# Patient Record
Sex: Female | Born: 1970 | Race: White | Hispanic: No | Marital: Single | Smoking: Never smoker
Health system: Southern US, Community
[De-identification: ages and names within clinical notes are randomized; demographics above are authoritative.]

## PROBLEM LIST (undated history)

## (undated) DIAGNOSIS — R011 Cardiac murmur, unspecified: Secondary | ICD-10-CM

## (undated) DIAGNOSIS — Z8619 Personal history of other infectious and parasitic diseases: Secondary | ICD-10-CM

## (undated) DIAGNOSIS — R002 Palpitations: Secondary | ICD-10-CM

## (undated) DIAGNOSIS — T7840XA Allergy, unspecified, initial encounter: Secondary | ICD-10-CM

## (undated) DIAGNOSIS — M25569 Pain in unspecified knee: Secondary | ICD-10-CM

## (undated) DIAGNOSIS — R5383 Other fatigue: Secondary | ICD-10-CM

## (undated) DIAGNOSIS — M2550 Pain in unspecified joint: Secondary | ICD-10-CM

## (undated) DIAGNOSIS — S92919A Unspecified fracture of unspecified toe(s), initial encounter for closed fracture: Secondary | ICD-10-CM

## (undated) DIAGNOSIS — O3440 Maternal care for other abnormalities of cervix, unspecified trimester: Secondary | ICD-10-CM

## (undated) DIAGNOSIS — O99210 Obesity complicating pregnancy, unspecified trimester: Secondary | ICD-10-CM

## (undated) DIAGNOSIS — Z9889 Other specified postprocedural states: Secondary | ICD-10-CM

## (undated) HISTORY — DX: Pain in unspecified joint: M25.50

## (undated) HISTORY — DX: Palpitations: R00.2

## (undated) HISTORY — PX: OTHER SURGICAL HISTORY: SHX169

## (undated) HISTORY — DX: Other specified postprocedural states: Z98.890

## (undated) HISTORY — DX: Pain in unspecified knee: M25.569

## (undated) HISTORY — DX: Maternal care for other abnormalities of cervix, unspecified trimester: O34.40

## (undated) HISTORY — DX: Personal history of other infectious and parasitic diseases: Z86.19

## (undated) HISTORY — DX: Allergy, unspecified, initial encounter: T78.40XA

## (undated) HISTORY — DX: Other fatigue: R53.83

## (undated) HISTORY — DX: Obesity complicating pregnancy, unspecified trimester: O99.210

## (undated) HISTORY — DX: Unspecified fracture of unspecified toe(s), initial encounter for closed fracture: S92.919A

---
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
## 1999-03-05 ENCOUNTER — Other Ambulatory Visit: Admission: RE | Admit: 1999-03-05 | Discharge: 1999-03-05 | Payer: Self-pay | Admitting: Obstetrics and Gynecology

## 2000-05-12 ENCOUNTER — Other Ambulatory Visit: Admission: RE | Admit: 2000-05-12 | Discharge: 2000-05-12 | Payer: Self-pay | Admitting: *Deleted

## 2009-07-27 HISTORY — PX: MYOMECTOMY: SHX85

## 2010-01-22 LAB — HEPATITIS B SURFACE ANTIGEN: Hepatitis B Surface Ag: NEGATIVE

## 2010-01-22 LAB — RUBELLA ANTIBODY, IGM: Rubella: IMMUNE

## 2010-01-22 LAB — RPR: RPR: NONREACTIVE

## 2011-04-09 ENCOUNTER — Ambulatory Visit (HOSPITAL_COMMUNITY)
Admission: RE | Admit: 2011-04-09 | Discharge: 2011-04-09 | Disposition: A | Discharge status: Home / Self Care | Payer: Managed Care, Other (non HMO) | Source: Ambulatory Visit | Attending: Obstetrics and Gynecology | Admitting: Obstetrics and Gynecology

## 2011-04-09 ENCOUNTER — Other Ambulatory Visit (HOSPITAL_COMMUNITY): Payer: Self-pay | Admitting: Obstetrics and Gynecology

## 2011-04-09 ENCOUNTER — Encounter (HOSPITAL_COMMUNITY): Payer: Self-pay

## 2011-04-09 DIAGNOSIS — O4420 Partial placenta previa NOS or without hemorrhage, unspecified trimester: Secondary | ICD-10-CM

## 2011-04-09 DIAGNOSIS — O26859 Spotting complicating pregnancy, unspecified trimester: Secondary | ICD-10-CM

## 2011-04-09 DIAGNOSIS — O4410 Placenta previa with hemorrhage, unspecified trimester: Secondary | ICD-10-CM | POA: Insufficient documentation

## 2011-04-09 DIAGNOSIS — O09519 Supervision of elderly primigravida, unspecified trimester: Secondary | ICD-10-CM | POA: Insufficient documentation

## 2011-04-09 DIAGNOSIS — O09529 Supervision of elderly multigravida, unspecified trimester: Secondary | ICD-10-CM

## 2011-04-22 ENCOUNTER — Other Ambulatory Visit (HOSPITAL_COMMUNITY): Payer: Self-pay | Admitting: Obstetrics and Gynecology

## 2011-04-22 DIAGNOSIS — O2690 Pregnancy related conditions, unspecified, unspecified trimester: Secondary | ICD-10-CM

## 2011-05-04 ENCOUNTER — Ambulatory Visit (HOSPITAL_COMMUNITY)
Admission: RE | Admit: 2011-05-04 | Discharge: 2011-05-04 | Disposition: A | Discharge status: Home / Self Care | Payer: Managed Care, Other (non HMO) | Source: Ambulatory Visit | Attending: Obstetrics and Gynecology | Admitting: Obstetrics and Gynecology

## 2011-05-04 DIAGNOSIS — O09519 Supervision of elderly primigravida, unspecified trimester: Secondary | ICD-10-CM | POA: Insufficient documentation

## 2011-05-04 DIAGNOSIS — O3440 Maternal care for other abnormalities of cervix, unspecified trimester: Secondary | ICD-10-CM | POA: Insufficient documentation

## 2011-05-04 DIAGNOSIS — A6000 Herpesviral infection of urogenital system, unspecified: Secondary | ICD-10-CM | POA: Insufficient documentation

## 2011-05-04 DIAGNOSIS — O4410 Placenta previa with hemorrhage, unspecified trimester: Secondary | ICD-10-CM | POA: Insufficient documentation

## 2011-05-04 DIAGNOSIS — O2690 Pregnancy related conditions, unspecified, unspecified trimester: Secondary | ICD-10-CM

## 2011-05-04 DIAGNOSIS — O98519 Other viral diseases complicating pregnancy, unspecified trimester: Secondary | ICD-10-CM | POA: Insufficient documentation

## 2011-05-04 IMAGING — US US OB DETAIL+14 WK
1 series · 14 of 28 positions shown · non-contrast
Comparison: none

[Series 1: us ob detail+14 wk · 0.23mm/px · 14 of 98 slices shown]
[im 4/98]
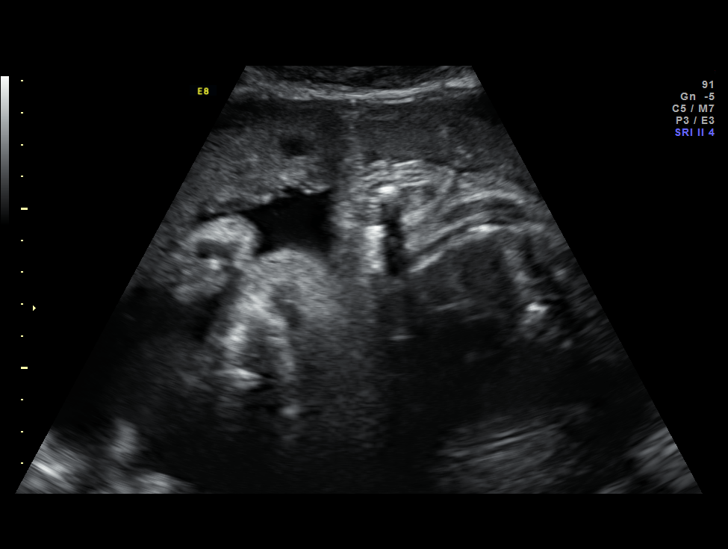
[im 11/98]
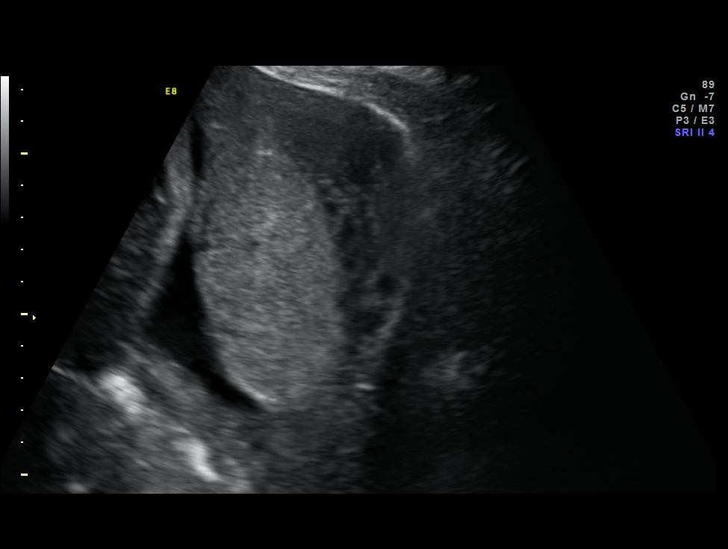
[im 18/98]
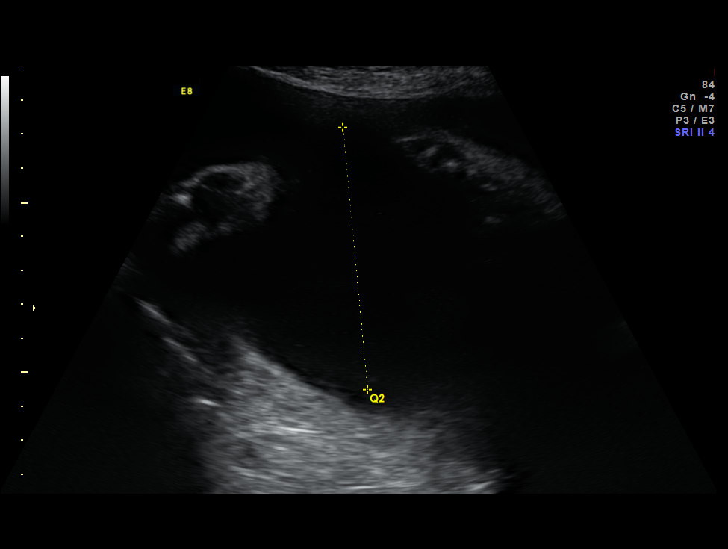
[im 26/98]
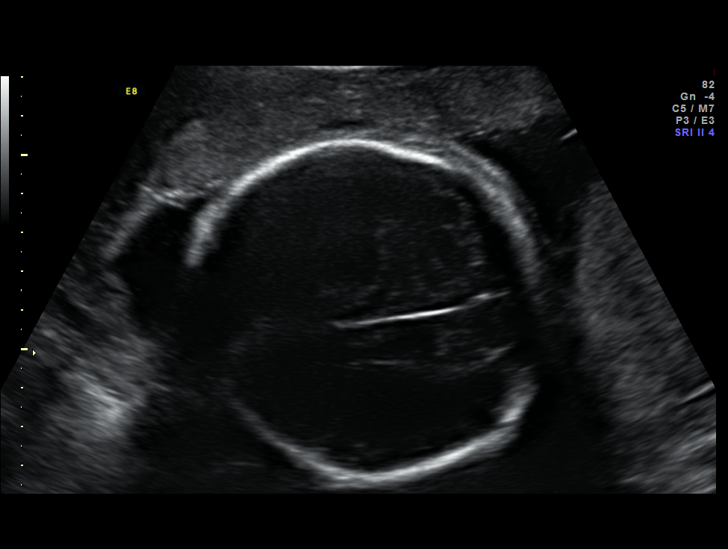
[im 33/98]
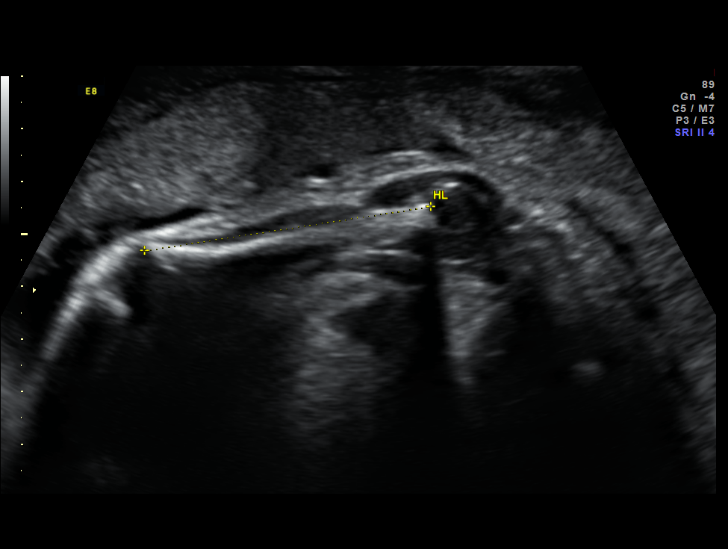
[im 40/98]
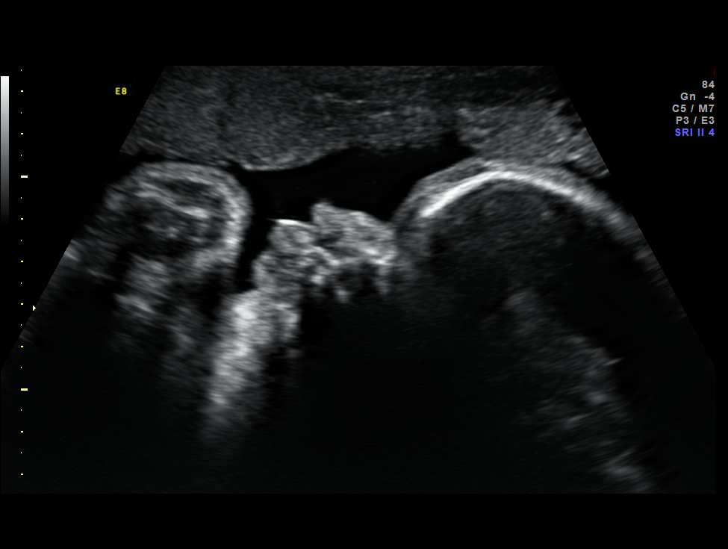
[im 47/98]
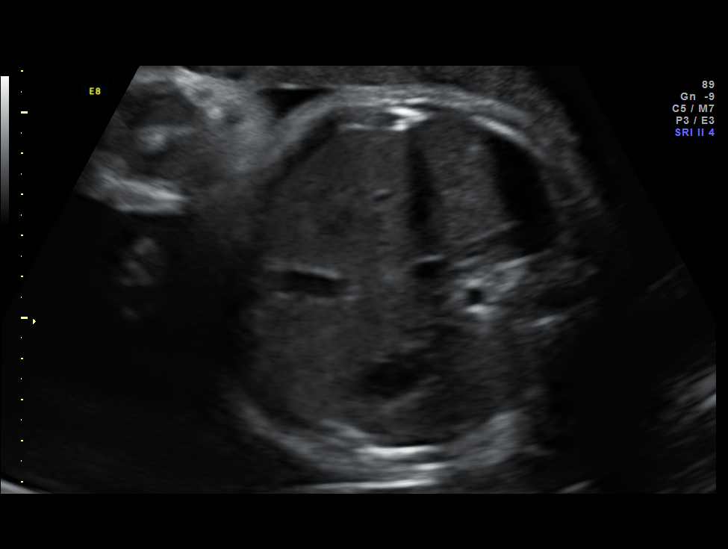
[im 54/98]
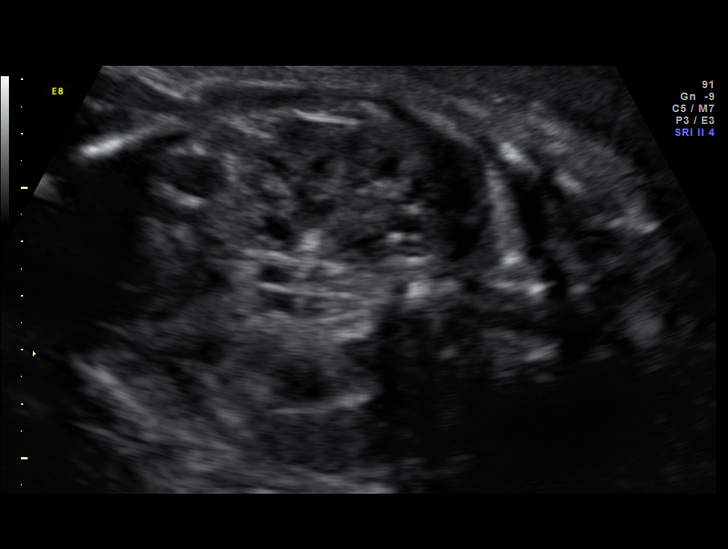
[im 62/98]
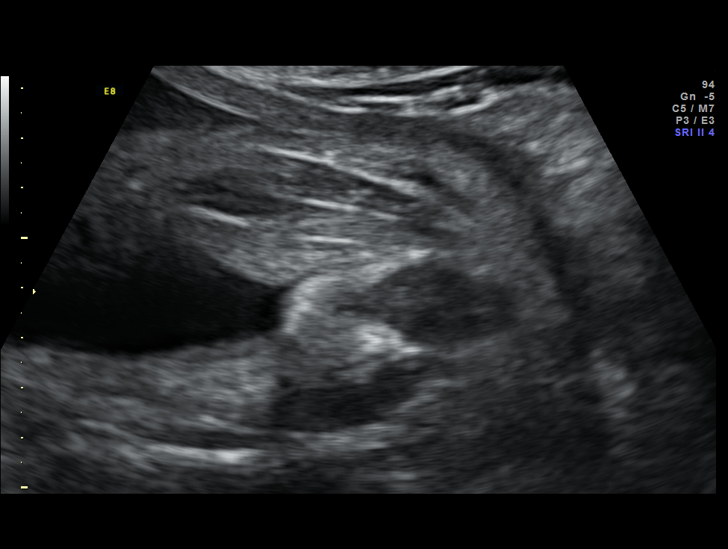
[im 69/98]
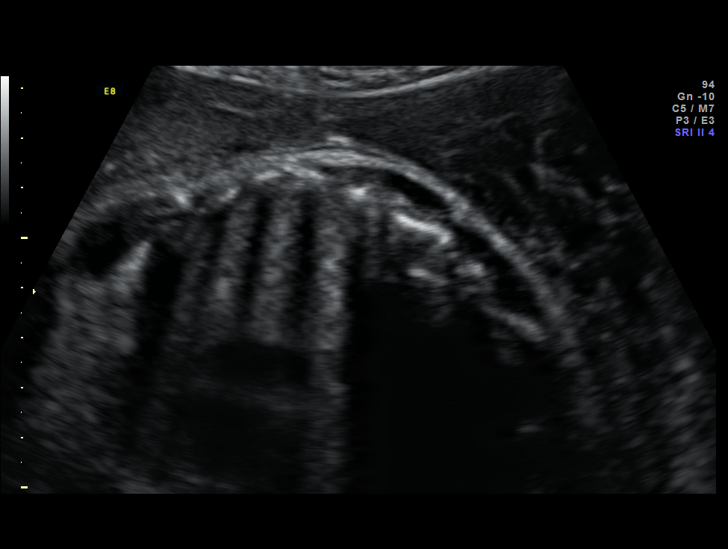
[im 76/98]
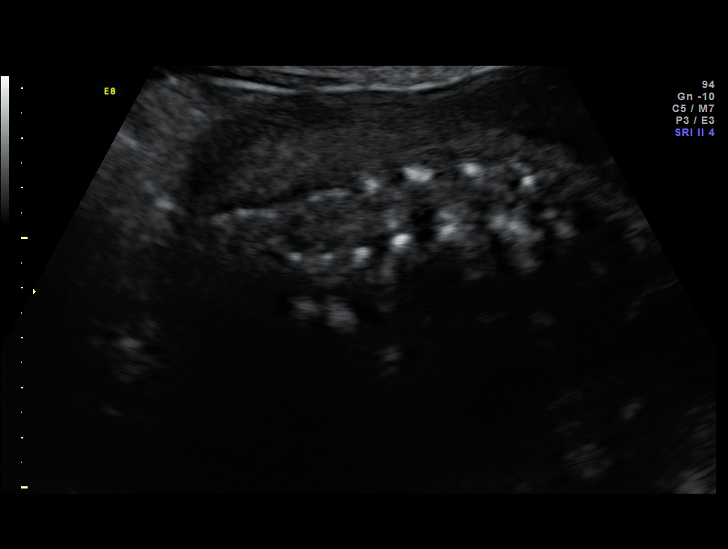
[im 83/98]
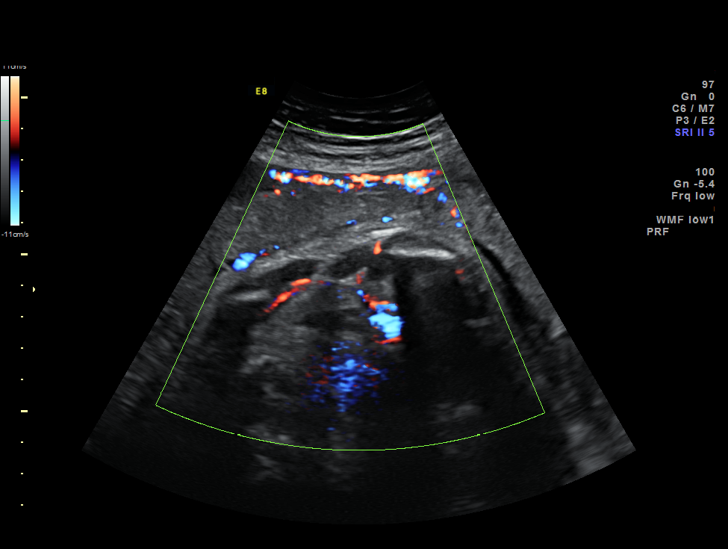
[im 90/98]
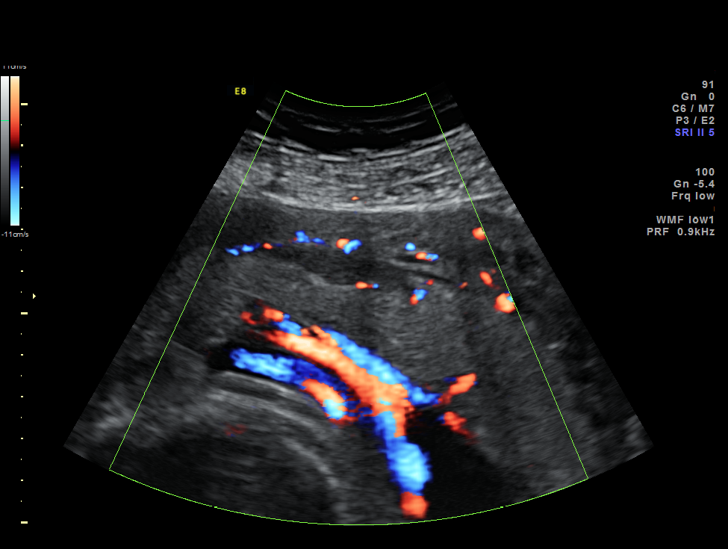
[im 98/98]
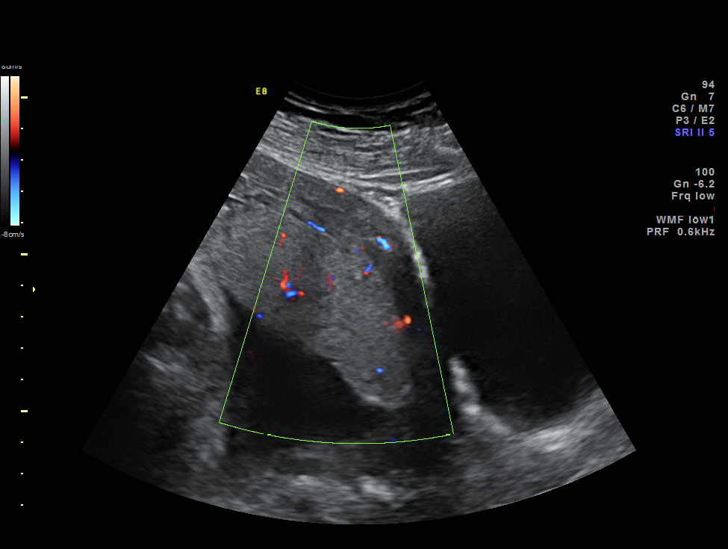

[14 of 28 positions shown; findings below may reference images not displayed]

Canned report from images found in remote index.

Refer to host system for actual result text.

## 2011-05-05 ENCOUNTER — Other Ambulatory Visit: Payer: Self-pay | Admitting: Obstetrics and Gynecology

## 2011-05-07 ENCOUNTER — Other Ambulatory Visit: Payer: Self-pay | Admitting: Obstetrics and Gynecology

## 2011-05-21 ENCOUNTER — Encounter (HOSPITAL_COMMUNITY): Payer: Self-pay | Admitting: Pharmacy Technician

## 2011-05-26 ENCOUNTER — Encounter (HOSPITAL_COMMUNITY)
Admission: RE | Admit: 2011-05-26 | Discharge: 2011-05-26 | Disposition: A | Discharge status: Home / Self Care | Payer: Managed Care, Other (non HMO) | Source: Ambulatory Visit | Attending: Obstetrics and Gynecology | Admitting: Obstetrics and Gynecology

## 2011-05-26 ENCOUNTER — Encounter (HOSPITAL_COMMUNITY): Payer: Self-pay

## 2011-05-26 HISTORY — DX: Cardiac murmur, unspecified: R01.1

## 2011-05-26 LAB — CBC
HCT: 35 % — ABNORMAL LOW (ref 36.0–46.0)
Hemoglobin: 11.7 g/dL — ABNORMAL LOW (ref 12.0–15.0)
MCH: 30.5 pg (ref 26.0–34.0)
MCHC: 33.4 g/dL (ref 30.0–36.0)
MCV: 91.1 fL (ref 78.0–100.0)

## 2011-05-26 LAB — SURGICAL PCR SCREEN: Staphylococcus aureus: NEGATIVE

## 2011-05-26 NOTE — Patient Instructions (Signed)
   Your procedure is scheduled on:05/29/11  Enter through the Main Entrance of [facility] at:6am  Pick up the phone at the desk and dial [phone] and inform us of your arrival.  Please call this number if you have any problems the morning of surgery: [phone]  Remember: Do not eat food after midnight:Thursday Do not drink clear liquids after:3:30am Fri Take these medicines the morning of surgery with a SIP OF WATER:none  Do not wear jewelry, make-up, or FINGER nail polish Do not wear lotions, powders, or perfumes.  You may wear deodorant. Do not shave 48 hours prior to surgery. Do not bring valuables to the hospital.  Leave suitcase in the car. After Surgery it may be brought to your room. For patients being admitted to the hospital, checkout time is 11:00am the day of discharge.  Patients discharged on the day of surgery will not be allowed to drive home.   Name and phone number of your driver: Kingsley Spittle  Remember to use your hibiclens as instructed.Please shower with 1/2 bottle the evening before your surgery and the other 1/2 bottle the morning of surgery.

## 2011-05-27 NOTE — Pre-Procedure Instructions (Signed)
Antibody screen result not found on prenatal record. Limited pages received after 3 requests from office to send.

## 2011-05-28 ENCOUNTER — Encounter (HOSPITAL_COMMUNITY): Payer: Self-pay | Admitting: Obstetrics and Gynecology

## 2011-05-28 DIAGNOSIS — Z9889 Other specified postprocedural states: Secondary | ICD-10-CM | POA: Insufficient documentation

## 2011-05-28 DIAGNOSIS — O09819 Supervision of pregnancy resulting from assisted reproductive technology, unspecified trimester: Secondary | ICD-10-CM | POA: Insufficient documentation

## 2011-05-28 DIAGNOSIS — O4400 Placenta previa specified as without hemorrhage, unspecified trimester: Secondary | ICD-10-CM | POA: Insufficient documentation

## 2011-05-28 DIAGNOSIS — O99210 Obesity complicating pregnancy, unspecified trimester: Secondary | ICD-10-CM | POA: Insufficient documentation

## 2011-05-28 DIAGNOSIS — Z8619 Personal history of other infectious and parasitic diseases: Secondary | ICD-10-CM | POA: Insufficient documentation

## 2011-05-28 DIAGNOSIS — O2690 Pregnancy related conditions, unspecified, unspecified trimester: Secondary | ICD-10-CM | POA: Insufficient documentation

## 2011-05-28 MED ORDER — CEFAZOLIN SODIUM-DEXTROSE 2-3 GM-% IV SOLR
2.0000 g | INTRAVENOUS | Status: AC
  Administered 2011-05-29: 2 g via INTRAVENOUS
  Filled 2011-05-28: qty 50

## 2011-05-29 ENCOUNTER — Encounter (HOSPITAL_COMMUNITY): Payer: Self-pay | Admitting: Obstetrics and Gynecology

## 2011-05-29 ENCOUNTER — Inpatient Hospital Stay (HOSPITAL_COMMUNITY): Payer: Managed Care, Other (non HMO) | Admitting: Anesthesiology

## 2011-05-29 ENCOUNTER — Encounter (HOSPITAL_COMMUNITY): Payer: Self-pay | Admitting: Anesthesiology

## 2011-05-29 ENCOUNTER — Encounter (HOSPITAL_COMMUNITY)
Admission: AD | Disposition: A | Discharge status: Home / Self Care | Payer: Self-pay | Source: Ambulatory Visit | Attending: Obstetrics and Gynecology

## 2011-05-29 ENCOUNTER — Encounter (HOSPITAL_COMMUNITY): Payer: Self-pay | Admitting: *Deleted

## 2011-05-29 ENCOUNTER — Inpatient Hospital Stay (HOSPITAL_COMMUNITY)
Admission: AD | Admit: 2011-05-29 | Discharge: 2011-06-01 | DRG: 765 | Disposition: A | Discharge status: Home / Self Care | Payer: Managed Care, Other (non HMO) | Source: Ambulatory Visit | Attending: Obstetrics and Gynecology | Admitting: Obstetrics and Gynecology

## 2011-05-29 ENCOUNTER — Other Ambulatory Visit: Payer: Self-pay | Admitting: Obstetrics and Gynecology

## 2011-05-29 DIAGNOSIS — O09519 Supervision of elderly primigravida, unspecified trimester: Secondary | ICD-10-CM | POA: Diagnosis present

## 2011-05-29 DIAGNOSIS — O4410 Placenta previa with hemorrhage, unspecified trimester: Principal | ICD-10-CM | POA: Diagnosis present

## 2011-05-29 DIAGNOSIS — O3490 Maternal care for abnormality of pelvic organ, unspecified, unspecified trimester: Secondary | ICD-10-CM | POA: Diagnosis present

## 2011-05-29 LAB — TYPE AND SCREEN
ABO/RH(D): O POS
Antibody Screen: NEGATIVE

## 2011-05-29 SURGERY — Surgical Case
Anesthesia: Spinal | Site: Abdomen | Wound class: Clean Contaminated

## 2011-05-29 MED ORDER — FENTANYL CITRATE 0.05 MG/ML IJ SOLN
INTRAMUSCULAR | Status: AC
  Filled 2011-05-29: qty 2

## 2011-05-29 MED ORDER — OXYTOCIN 20 UNITS IN LACTATED RINGERS INFUSION - SIMPLE
125.0000 mL/h | INTRAVENOUS | Status: AC
  Administered 2011-05-29: 125 mL/h via INTRAVENOUS

## 2011-05-29 MED ORDER — NALBUPHINE SYRINGE 5 MG/0.5 ML
5.0000 mg | INJECTION | INTRAMUSCULAR | Status: DC | PRN
  Filled 2011-05-29: qty 1

## 2011-05-29 MED ORDER — DIBUCAINE 1 % RE OINT: 1.0000 "application " | TOPICAL_OINTMENT | RECTAL | Status: DC | PRN

## 2011-05-29 MED ORDER — MORPHINE SULFATE 0.5 MG/ML IJ SOLN
INTRAMUSCULAR | Status: AC
  Filled 2011-05-29: qty 10

## 2011-05-29 MED ORDER — MENTHOL 3 MG MT LOZG: 1.0000 | LOZENGE | OROMUCOSAL | Status: DC | PRN

## 2011-05-29 MED ORDER — OXYCODONE-ACETAMINOPHEN 5-325 MG PO TABS
1.0000 | ORAL_TABLET | ORAL | Status: DC | PRN
  Administered 2011-05-30 – 2011-05-31 (×3): 1 via ORAL
  Filled 2011-05-29 (×3): qty 1

## 2011-05-29 MED ORDER — OXYTOCIN 10 UNIT/ML IJ SOLN
INTRAMUSCULAR | Status: AC
  Filled 2011-05-29: qty 2

## 2011-05-29 MED ORDER — DIPHENHYDRAMINE HCL 25 MG PO CAPS: 25.0000 mg | ORAL_CAPSULE | Freq: Four times a day (QID) | ORAL | Status: DC | PRN

## 2011-05-29 MED ORDER — ONDANSETRON HCL 4 MG PO TABS: 4.0000 mg | ORAL_TABLET | ORAL | Status: DC | PRN

## 2011-05-29 MED ORDER — ONDANSETRON HCL 4 MG/2ML IJ SOLN
INTRAMUSCULAR | Status: AC
  Filled 2011-05-29: qty 2

## 2011-05-29 MED ORDER — TETANUS-DIPHTH-ACELL PERTUSSIS 5-2.5-18.5 LF-MCG/0.5 IM SUSP
0.5000 mL | Freq: Once | INTRAMUSCULAR | Status: AC
  Administered 2011-05-30: 0.5 mL via INTRAMUSCULAR
  Filled 2011-05-29: qty 0.5

## 2011-05-29 MED ORDER — ONDANSETRON HCL 4 MG/2ML IJ SOLN
INTRAMUSCULAR | Status: DC | PRN
  Administered 2011-05-29: 4 mg via INTRAVENOUS

## 2011-05-29 MED ORDER — BUPIVACAINE IN DEXTROSE 0.75-8.25 % IT SOLN
INTRATHECAL | Status: DC | PRN
  Administered 2011-05-29: 15 mg via INTRATHECAL

## 2011-05-29 MED ORDER — BUPIVACAINE HCL (PF) 0.25 % IJ SOLN
INTRAMUSCULAR | Status: DC | PRN
  Administered 2011-05-29: 20 mL

## 2011-05-29 MED ORDER — SIMETHICONE 80 MG PO CHEW
80.0000 mg | CHEWABLE_TABLET | Freq: Three times a day (TID) | ORAL | Status: DC
  Administered 2011-05-29 – 2011-06-01 (×10): 80 mg via ORAL

## 2011-05-29 MED ORDER — NALOXONE HCL 0.4 MG/ML IJ SOLN: 0.4000 mg | INTRAMUSCULAR | Status: DC | PRN

## 2011-05-29 MED ORDER — IBUPROFEN 600 MG PO TABS
600.0000 mg | ORAL_TABLET | Freq: Four times a day (QID) | ORAL | Status: DC
  Administered 2011-05-29 – 2011-06-01 (×11): 600 mg via ORAL
  Filled 2011-05-29 (×10): qty 1

## 2011-05-29 MED ORDER — DIPHENHYDRAMINE HCL 25 MG PO CAPS: 25.0000 mg | ORAL_CAPSULE | ORAL | Status: DC | PRN

## 2011-05-29 MED ORDER — SODIUM CHLORIDE 0.9 % IJ SOLN: 3.0000 mL | INTRAMUSCULAR | Status: DC | PRN

## 2011-05-29 MED ORDER — LACTATED RINGERS IV SOLN
INTRAVENOUS | Status: DC
  Administered 2011-05-29 (×3): via INTRAVENOUS

## 2011-05-29 MED ORDER — SCOPOLAMINE 1 MG/3DAYS TD PT72
MEDICATED_PATCH | TRANSDERMAL | Status: AC
  Filled 2011-05-29: qty 1

## 2011-05-29 MED ORDER — SIMETHICONE 80 MG PO CHEW: 80.0000 mg | CHEWABLE_TABLET | ORAL | Status: DC | PRN

## 2011-05-29 MED ORDER — OXYTOCIN 20 UNITS IN LACTATED RINGERS INFUSION - SIMPLE
INTRAVENOUS | Status: AC
  Filled 2011-05-29: qty 1000

## 2011-05-29 MED ORDER — PHENYLEPHRINE 40 MCG/ML (10ML) SYRINGE FOR IV PUSH (FOR BLOOD PRESSURE SUPPORT)
PREFILLED_SYRINGE | INTRAVENOUS | Status: AC
  Filled 2011-05-29: qty 5

## 2011-05-29 MED ORDER — SCOPOLAMINE 1 MG/3DAYS TD PT72
MEDICATED_PATCH | TRANSDERMAL | Status: AC
  Administered 2011-05-29: 1.5 mg via TRANSDERMAL
  Filled 2011-05-29: qty 1

## 2011-05-29 MED ORDER — FENTANYL CITRATE 0.05 MG/ML IJ SOLN
INTRAMUSCULAR | Status: DC | PRN
  Administered 2011-05-29: 25 ug via INTRATHECAL

## 2011-05-29 MED ORDER — OXYTOCIN 20 UNITS IN LACTATED RINGERS INFUSION - SIMPLE
INTRAVENOUS | Status: DC | PRN
  Administered 2011-05-29: 20 [IU] via INTRAVENOUS

## 2011-05-29 MED ORDER — WITCH HAZEL-GLYCERIN EX PADS: 1.0000 "application " | MEDICATED_PAD | CUTANEOUS | Status: DC | PRN

## 2011-05-29 MED ORDER — SCOPOLAMINE 1 MG/3DAYS TD PT72
1.0000 | MEDICATED_PATCH | Freq: Once | TRANSDERMAL | Status: DC
  Administered 2011-05-29: 1.5 mg via TRANSDERMAL

## 2011-05-29 MED ORDER — MORPHINE SULFATE (PF) 0.5 MG/ML IJ SOLN
INTRAMUSCULAR | Status: DC | PRN
  Administered 2011-05-29: 150 ug via INTRATHECAL

## 2011-05-29 MED ORDER — SENNOSIDES-DOCUSATE SODIUM 8.6-50 MG PO TABS
2.0000 | ORAL_TABLET | Freq: Every day | ORAL | Status: DC
  Administered 2011-05-30 – 2011-05-31 (×3): 2 via ORAL

## 2011-05-29 MED ORDER — SODIUM CHLORIDE 0.9 % IV SOLN
1.0000 ug/kg/h | INTRAVENOUS | Status: DC | PRN
  Filled 2011-05-29: qty 2.5

## 2011-05-29 MED ORDER — LANOLIN HYDROUS EX OINT: 1.0000 "application " | TOPICAL_OINTMENT | CUTANEOUS | Status: DC | PRN

## 2011-05-29 MED ORDER — ONDANSETRON HCL 4 MG/2ML IJ SOLN: 4.0000 mg | Freq: Three times a day (TID) | INTRAMUSCULAR | Status: DC | PRN

## 2011-05-29 MED ORDER — MEPERIDINE HCL 25 MG/ML IJ SOLN: 6.2500 mg | INTRAMUSCULAR | Status: DC | PRN

## 2011-05-29 MED ORDER — ZOLPIDEM TARTRATE 5 MG PO TABS: 5.0000 mg | ORAL_TABLET | Freq: Every evening | ORAL | Status: DC | PRN

## 2011-05-29 MED ORDER — METOCLOPRAMIDE HCL 5 MG/ML IJ SOLN: 10.0000 mg | Freq: Three times a day (TID) | INTRAMUSCULAR | Status: DC | PRN

## 2011-05-29 MED ORDER — PRENATAL PLUS 27-1 MG PO TABS
1.0000 | ORAL_TABLET | Freq: Every day | ORAL | Status: DC
  Administered 2011-05-30 – 2011-06-01 (×3): 1 via ORAL
  Filled 2011-05-29 (×3): qty 1

## 2011-05-29 MED ORDER — METOCLOPRAMIDE HCL 5 MG/ML IJ SOLN: 10.0000 mg | Freq: Once | INTRAMUSCULAR | Status: DC | PRN

## 2011-05-29 MED ORDER — DIPHENHYDRAMINE HCL 50 MG/ML IJ SOLN: 12.5000 mg | INTRAMUSCULAR | Status: DC | PRN

## 2011-05-29 MED ORDER — PHENYLEPHRINE HCL 10 MG/ML IJ SOLN
INTRAMUSCULAR | Status: DC | PRN
  Administered 2011-05-29 (×2): 80 ug via INTRAVENOUS

## 2011-05-29 MED ORDER — HYDROMORPHONE HCL PF 1 MG/ML IJ SOLN: 0.2500 mg | INTRAMUSCULAR | Status: DC | PRN

## 2011-05-29 MED ORDER — LACTATED RINGERS IV SOLN
INTRAVENOUS | Status: DC
  Administered 2011-05-29: 19:00:00 via INTRAVENOUS

## 2011-05-29 MED ORDER — DIPHENHYDRAMINE HCL 50 MG/ML IJ SOLN: 25.0000 mg | INTRAMUSCULAR | Status: DC | PRN

## 2011-05-29 SURGICAL SUPPLY — 49 items
ADH SKN CLS APL DERMABOND .7 (GAUZE/BANDAGES/DRESSINGS)
APL SKNCLS STERI-STRIP NONHPOA (GAUZE/BANDAGES/DRESSINGS)
BENZOIN TINCTURE PRP APPL 2/3 (GAUZE/BANDAGES/DRESSINGS) IMPLANT
BLADE EXTENDED COATED 6.5IN (ELECTRODE) IMPLANT
BLADE HEX COATED 2.75 (ELECTRODE) IMPLANT
BLADE SURG 10 STRL SS (BLADE) ×1 IMPLANT
BOOTIES KNEE HIGH SLOAN (MISCELLANEOUS) ×4 IMPLANT
CHLORAPREP W/TINT 26ML (MISCELLANEOUS) ×2 IMPLANT
CLOTH BEACON ORANGE TIMEOUT ST (SAFETY) ×2 IMPLANT
CONTAINER PREFILL 10% NBF 15ML (MISCELLANEOUS) ×4 IMPLANT
DERMABOND ADVANCED (GAUZE/BANDAGES/DRESSINGS)
DERMABOND ADVANCED .7 DNX12 (GAUZE/BANDAGES/DRESSINGS) IMPLANT
DRAIN JACKSON PRT FLT 7MM (DRAIN) IMPLANT
DRESSING TELFA 8X3 (GAUZE/BANDAGES/DRESSINGS) ×1 IMPLANT
ELECT REM PT RETURN 9FT ADLT (ELECTROSURGICAL) ×2
ELECTRODE REM PT RTRN 9FT ADLT (ELECTROSURGICAL) ×1 IMPLANT
EVACUATOR SILICONE 100CC (DRAIN) IMPLANT
EXTRACTOR VACUUM KIWI (MISCELLANEOUS) ×1 IMPLANT
EXTRACTOR VACUUM M CUP 4 TUBE (SUCTIONS) IMPLANT
GAUZE SPONGE 4X4 12PLY STRL LF (GAUZE/BANDAGES/DRESSINGS) ×3 IMPLANT
GLOVE SURG SS PI 6.5 STRL IVOR (GLOVE) ×4 IMPLANT
GOWN PREVENTION PLUS LG XLONG (DISPOSABLE) ×6 IMPLANT
KIT ABG SYR 3ML LUER SLIP (SYRINGE) IMPLANT
NDL HYPO 25X5/8 SAFETYGLIDE (NEEDLE) ×1 IMPLANT
NDL SPNL 22GX3.5 QUINCKE BK (NEEDLE) ×1 IMPLANT
NEEDLE HYPO 25X5/8 SAFETYGLIDE (NEEDLE) ×2 IMPLANT
NEEDLE SPNL 22GX3.5 QUINCKE BK (NEEDLE) ×2 IMPLANT
NS IRRIG 1000ML POUR BTL (IV SOLUTION) ×2 IMPLANT
PACK C SECTION WH (CUSTOM PROCEDURE TRAY) ×2 IMPLANT
PAD ABD 7.5X8 STRL (GAUZE/BANDAGES/DRESSINGS) ×1 IMPLANT
SLEEVE SCD COMPRESS KNEE MED (MISCELLANEOUS) IMPLANT
STRIP CLOSURE SKIN 1/4X4 (GAUZE/BANDAGES/DRESSINGS) IMPLANT
SUT CHROMIC 2 0 SH (SUTURE) ×1 IMPLANT
SUT MNCRL AB 3-0 PS2 27 (SUTURE) ×2 IMPLANT
SUT SILK 0 FSL (SUTURE) IMPLANT
SUT VIC AB 0 CT1 27 (SUTURE) ×4
SUT VIC AB 0 CT1 27XBRD ANBCTR (SUTURE) ×2 IMPLANT
SUT VIC AB 0 CT1 36 (SUTURE) ×5 IMPLANT
SUT VIC AB 2-0 CT1 27 (SUTURE) ×2
SUT VIC AB 2-0 CT1 TAPERPNT 27 (SUTURE) ×2 IMPLANT
SUT VIC AB 2-0 SH 27 (SUTURE)
SUT VIC AB 2-0 SH 27XBRD (SUTURE) IMPLANT
SYR CONTROL 10ML LL (SYRINGE) ×2 IMPLANT
TAPE CLOTH SURG 4X10 WHT LF (GAUZE/BANDAGES/DRESSINGS) ×1 IMPLANT
TOWEL OR 17X24 6PK STRL BLUE (TOWEL DISPOSABLE) ×4 IMPLANT
TRAY FOLEY CATH 14FR (SET/KITS/TRAYS/PACK) ×2 IMPLANT
TUBING SUCTION BULK 100 FT (MISCELLANEOUS) ×1 IMPLANT
WATER STERILE IRR 1000ML POUR (IV SOLUTION) ×2 IMPLANT
YANKAUER SUCT BULB TIP NO VENT (SUCTIONS) ×1 IMPLANT

## 2011-05-29 NOTE — Transfer of Care (Signed)
Immediate Anesthesia Transfer of Care Note  Patient: Katie Holmes  Procedure(s) Performed:  CESAREAN SECTION - Primary/placenta previa BMI 47  Patient Location: PACU  Anesthesia Type: Spinal  Level of Consciousness: awake and alert   Airway & Oxygen Therapy: Patient Spontanous Breathing  Post-op Assessment: Report given to PACU RN and Post -op Vital signs reviewed and stable  Post vital signs: Reviewed and stable  Complications: No apparent anesthesia complications

## 2011-05-29 NOTE — Addendum Note (Signed)
Addendum  created 05/29/11 1022 by Tyrone Apple. [employer] edited:Orders, PRL Based Tax adviser

## 2011-05-29 NOTE — Anesthesia Preprocedure Evaluation (Addendum)
Anesthesia Evaluation  Patient identified by MRN, date of birth, ID band Patient awake    Reviewed: Allergy & Precautions, H&P , NPO status , Patient's Chart, lab work & pertinent test results, reviewed documented beta blocker date and time   History of Anesthesia Complications Negative for: history of anesthetic complications  Airway Mallampati: I TM Distance: >3 FB Neck ROM: full    Dental  (+) Teeth Intact   Pulmonary neg pulmonary ROS,  clear to auscultation        Cardiovascular Exercise Tolerance: Good + Valvular Problems/Murmurs (normal echo, "innocent murmur") regular Normal    Neuro/Psych Negative Neurological ROS  Negative Psych ROS   GI/Hepatic negative GI ROS, Neg liver ROS,   Endo/Other  Morbid obesity  Renal/GU negative Renal ROS  Genitourinary negative   Musculoskeletal   Abdominal   Peds  Hematology negative hematology ROS (+)   Anesthesia Other Findings   Reproductive/Obstetrics (+) Pregnancy (s/p myomectomy, placenta previa)                           Anesthesia Physical Anesthesia Plan  ASA: III  Anesthesia Plan: Combined Spinal and Epidural   Post-op Pain Management:    Induction:   Airway Management Planned:   Additional Equipment:   Intra-op Plan:   Post-operative Plan:   Informed Consent: I have reviewed the patients History and Physical, chart, labs and discussed the procedure including the risks, benefits and alternatives for the proposed anesthesia with the patient or authorized representative who has indicated his/her understanding and acceptance.     Plan Discussed with: CRNA and Surgeon  Anesthesia Plan Comments:        Anesthesia Quick Evaluation

## 2011-05-29 NOTE — Anesthesia Procedure Notes (Signed)
Spinal Block  Patient location during procedure: OR Start time: 05/29/2011 7:45 AM Staffing Anesthesiologist: Hamzah Savoca A. Performed by: anesthesiologist  Preanesthetic Checklist Completed: patient identified, site marked, surgical consent, pre-op evaluation, timeout performed, IV checked, risks and benefits discussed and monitors and equipment checked Spinal Block Patient position: sitting Prep: site prepped and draped and DuraPrep Patient monitoring: cardiac monitor, continuous pulse ox, blood pressure and heart rate Approach: midline Location: L3-4 Injection technique: catheter Needle Needle type: Tuohy and Sprotte  Needle gauge: 24 G Needle length: 12.7 cm Needle insertion depth: 7 cm Catheter type: closed end flexible Catheter size: 19 g Assessment Sensory level: T4 Additional Notes Patient tolerated procedure well. Adequate sensory level.

## 2011-05-29 NOTE — H&P (Signed)
Katie Holmes is a 40 y.o. female presenting for scheduled primary c-section for complete previa and history of myomectomy Pregnancy significant for:  1. AMA 2. Sperm donor/IVF 3. HSV II 4. Obesity 5. Prev myomectomy 6. Hx cryo 7. Hx STI's 8. Hx heart murmor 9. Inc DSR - amnio nl Maternal Medical History:  Reason for admission: Scheduled primary c-section for complete previa  Fetal activity: Perceived fetal activity is normal.    Prenatal complications: Placental abnormality.     OB History    Grav Para Term Preterm Abortions TAB SAB Ect Mult Living   1 0 0 0 0 0 0 0 0 0      Past Medical History  Diagnosis Date  . Heart murmur     since childhood- no problems  . History of cryosurgery of cervix complicating pregnancy   . History of herpes simplex type 2 infection   . Obesity complicating pregnancy   . History of myomectomy    Past Surgical History  Procedure Date  . Myomectomy 2011   Family History: family history is not on file.CHTN - PGM, MGM: Diabetes - MGM: Stroke - MGM:  Social History:  reports that she has never smoked. She does not have any smokeless tobacco history on file. She reports that she does not drink alcohol or use illicit drugs. Pt is a single white female,   Review of Systems  All other systems reviewed and are negative.      Blood pressure 112/90, pulse 81, temperature 98.6 F (37 C), temperature source Oral, resp. rate 18, last menstrual period 09/15/2010, SpO2 100.00%. Maternal Exam:  Abdomen: Fundal height is aga.   Fetal presentation: vertex  Introitus: not evaluated.     Fetal Exam Fetal Monitor Review: Mode: hand-held doppler probe.       Physical Exam  Nursing note and vitals reviewed. Constitutional: She is oriented to person, place, and time. She appears well-developed and well-nourished.  Neck: Normal range of motion.  Cardiovascular: Normal rate, regular rhythm and normal heart sounds.   Respiratory: Effort normal and  breath sounds normal.  GI: Soft. There is no tenderness.  Neurological: She is alert and oriented to person, place, and time.  Skin: Skin is warm and dry.  Psychiatric: She has a normal mood and affect. Her behavior is normal.    Prenatal labs: ABO, Rh:  O pos Antibody:   Rubella:  Imm RPR: NON REACTIVE (10/30 0921)  HBsAg:   neg HIV:   neg GBS:  negative  1hr gtt -normal  Assessment/Plan: Admit to pre-op per Dr Pennie Rushing Routine pre-op orders  Pt is [redacted]w[redacted]d Primary C-section for previa   LILLARD,SHELLEY M 05/29/2011, 7:13 AM

## 2011-05-29 NOTE — H&P (Signed)
Katie Holmes, Katie Holmes                   ACCOUNT NO.:  [id number]  MEDICAL RECORD NO.:  [id number]  LOCATION:                                 FACILITY:  PHYSICIAN:  Hal Morales, M.D.DATE OF BIRTH:  1970-09-16  DATE OF ADMISSION:  05/29/2011 DATE OF DISCHARGE:                             HISTORY & PHYSICAL   HISTORY OF PRESENT ILLNESS:  The patient is a 40 year old, white single female, gravida 1, para 0, who presents for primary cesarean section. The patient had her last menstrual period on September 15, 2010.  Her pregnancy was achieved by in-vitro fertilization.  She has an estimated due date of June 22, 2011.  At approximately 24 weeks, the patient had the diagnosis of placenta previa made and this diagnosis has persisted throughout the pregnancy.  The patient has been evaluated by the [facility] at [facility], and no evidence of increta, accreta, percreta existed at the time of evaluation.  The fetus has experienced normal growth and at her last growth ultrasound was noted to have an estimated fetal weight of 6 pounds 5 ounces, with upper limits of normal amniotic fluid at 35 weeks' gestation.  The patient has had a single spotting episode, but no actual frank bleeding episode and has not required hospitalizations during this pregnancy.  She has a past history of myomectomy done just prior to her in-vitro fertilization with evidence of endometrial entry per the surgeon, who performed the procedure.  She thus presents for an elective primary cesarean section.  PAST HISTORY:  Medical history:  The patient has a history of an asymptomatic heart murmur.  She has a history of HSV in the remote past without any outbreaks during this pregnancy.  She has a history of cryosurgery in the remote past with all Pap smears being normal since that time.  She is obese, but had a normal hemoglobin A1c at her new OB workup.  Obstetrical history:  The patient had an increased  Down syndrome risk on first-trimester screen, but a normal amniocentesis on January 13, 2011 showing normal XX chromosomes.  Health maintenance:  The patient did have an influenza vaccine on April 20, 2011.  CURRENT MEDICATIONS:  Prenatal vitamins.  DRUG SENSITIVITIES:  No known allergies.  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  Does not smoke cigarettes. She drank alcohol socially prior to pregnancy, but has not had alcohol since becoming pregnant.  REVIEW OF SYSTEMS:  Positive for a single spotting episodes at approximately 29 weeks during her pregnancy which was self-limited and has not recurred.  PHYSICAL EXAMINATION:  GENERAL:  The patient is an obese white female, in no acute distress. LUNGS:  Clear. HEART:  Regular rate and rhythm. ABDOMEN:  Gravid with a 38-cm fundus. EXTREMITIES:  No clubbing or cyanosis.  There is 2+ pedal edema. VITAL SIGNS:  Blood pressure is 120/68.  Weight is 257 pounds.  Fetal heart tones were 156 beats per minute.  IMPRESSION: 1. Intrauterine pregnancy at 36 weeks and 4 days. 2. Anterior placenta previa. 3. History of myomectomy. 4. In-vitro fertilization pregnancy. 5. Advanced maternal age. 6. History  of HSV 2 with no outbreaks during this pregnancy.  DISPOSITION:  Several discussions have been held with the patient concerning the recommendation for cesarean section in light of her placenta previa and her history of myomectomy with endometrial entry. The risks of anesthesia, bleeding, infection, and damage to adjacent organs were reviewed, as well as the unique risks of inability to remove the placenta because of placental invasion of the myometrium.  The patient understands that under these circumstances, hysterectomy could possibly be required.  She has had an opportunity to ask questions and acknowledges that her questions have been answered.  She wishes to proceed  with primary cesarean section at [facility] on May 29, 2011.     Hal Morales, M.D.     VPH/MEDQ  D:  05/28/2011  T:  05/29/2011  Job:  [id number]

## 2011-05-29 NOTE — Anesthesia Postprocedure Evaluation (Signed)
Anesthesia Post Note  Patient: Katie Holmes  Procedure(s) Performed:  CESAREAN SECTION - Primary/placenta previa BMI 47  Anesthesia type: Spinal  Patient location: PACU  Post pain: Pain level controlled  Post assessment: Post-op Vital signs reviewed  Last Vitals:  Filed Vitals:   05/29/11 0906  BP: 126/40  Pulse: 66  Temp: 36.3 C  Resp: 16    Post vital signs: Reviewed  Level of consciousness: awake  Complications: No apparent anesthesia complications

## 2011-05-29 NOTE — Op Note (Signed)
Cesarean Section Procedure Note  Indications: placenta previa and previous myomectomy  Pre-operative Diagnosis: 36 week 4 day pregnancy.  Post-operative Diagnosis: same  Surgeon: Hal Morales  First Assistant:  Surgeon: Hal Morales   Assistants: Baird Kay, MD  Anesthesia: Epidural anesthesia, Local anesthesia 0.25.% bupivacaine and Spinal anesthesia  ASA Class: 2  Procedure Details  The patient was seen in the Holding Room. The risks, benefits, complications, treatment options, and expected outcomes were discussed with the patient.  The patient concurred with the proposed plan, giving informed consent.  The site of surgery properly noted/marked. The patient was taken to Operating Room # 1, identified as TEIA FREITAS and the procedure verified as C-Section Delivery. A Time Out was held and the above information confirmed.  After induction of anesthesia with spinal and epidural , the patient was  prepped with chloraprep and betadine for the perineum in the usual sterile manner.A foley catheter was placed under sterile conditions.  The patient was then draped in the usual fashion.   Suprapubicsubcutaneous injection of 0.25% Bupivacaine   A Pfannenstiel incision was made and carried down through the subcutaneous tissue to the fascia. Fascial incision was made and extended transversely. The fascia was separated from the underlying rectus tissue superiorly and inferiorly. The peritoneum was identified and entered. Peritoneal incision was extended longitudinally.  The bladder blade was placed.   A low transverse uterine incision was made two cm above the uterovesical fold and taken into the uterus, through the anterior portion of the placenta and into the amniotic sac. That incision extended transversely bluntly.The infant was  delivered from the occiput transverse position with the aid of a Kiwi vacuum  a 6 pound 8 ounce girl named Victory Dakin,  with Apgar scores of 4 at one minute and 8 at  five minutes. After the umbilical cord was clamped and cut cord blood was obtained for evaluation. The placenta was removed intact with evidence of transplacental entry into the amniotic sac. The uterine outline revealed several small fibroids.  The  tubes and ovaries appeared norma for the gravid state. The uterine incision was closed with running locked sutures of 0 Vicryl. An imbricating layer of sutures was placed. Hemostasis was observed. Lavage was carried out until clear. The peritoneum was closed with a running suture of 2-0 Vicryl.  The rectus muscles were reapproximated with a figure of 8 suture of 2-0 Vicryl.  The fascia was then reapproximated with a running sutures of 0 Vicryl .Renforcing figure of 8 sutures of 0Vicryl were placed on either side of midline.   The skin was reapproximated with l3-0 moncryl.  A sterile dressing was applied.  Instrument, sponge, and needle counts were correct prior to the abdominal closure and at the conclusion of the case.   Findings:  Placenta contained a 3vessel cord    Estimated Blood Loss:  1400cc         Drains: none          Total IV Fluids:         Specimens: Placenta to pathology         Implants: none         Complications: ::"None; patient tolerated the procedure well."         Disposition: PACU - hemodynamically stable.  The infant went to the fullterm nursery         Condition: stable  Attending Attestation: I performed the procedure.  Jobany Montellano P  05/29/2011 9:08 AM

## 2011-05-29 NOTE — H&P (Signed)
  I have reviewed history and physical and examined the patient.  No pertinent changes have occurred.  See dictated H&P for extensive discussion of risks, benefits and alternative therapies.

## 2011-05-30 LAB — CBC
MCHC: 34.1 g/dL (ref 30.0–36.0)
RDW: 13.6 % (ref 11.5–15.5)
WBC: 12.3 10*3/uL — ABNORMAL HIGH (ref 4.0–10.5)

## 2011-05-30 NOTE — Progress Notes (Signed)
Post Partum Day 1 Cesarean Section Subjective: no complaints, up ad lib, voiding and tolerating PO  Objective: Blood pressure 110/66, pulse 63, temperature 98 F (36.7 C), temperature source Oral, resp. rate 18, weight 115.214 kg (254 lb), last menstrual period 09/15/2010, SpO2 95.00%.  Physical Exam:  General: alert Lochia: appropriate Uterine Fundus: firm Incision: Dressing is Clean DVT Evaluation: No evidence of DVT seen on physical exam.   Basename 05/30/11 0548  HGB 10.4*  HCT 30.5*    Assessment/Plan: Breastfeeding Doing Well   LOS: 1 day   Katie Holmes V 05/30/2011, 8:59 AM

## 2011-05-31 ENCOUNTER — Encounter (HOSPITAL_COMMUNITY): Payer: Self-pay | Admitting: *Deleted

## 2011-05-31 NOTE — Progress Notes (Signed)
Post Partum Day 2, Cesarean Subjective: no complaints, up ad lib, voiding and + flatus  Objective: Blood pressure 115/77, pulse 71, temperature 97.8 F (36.6 C), temperature source Oral, resp. rate 18, weight 115.214 kg (254 lb), last menstrual period 09/15/2010, SpO2 96.00%, unknown if currently breastfeeding.  Physical Exam:  General: alert and cooperative Lochia: appropriate Uterine Fundus: firm Incision: healing well DVT Evaluation: No evidence of DVT seen on physical exam.   Basename 05/30/11 0548  HGB 10.4*  HCT 30.5*    Assessment/Plan: Breastfeeding and Contraception : Undecided May want to go home today.   LOS: 2 days   Katie Holmes V 05/31/2011, 9:16 AM

## 2011-06-01 ENCOUNTER — Encounter (HOSPITAL_COMMUNITY): Payer: Self-pay | Admitting: Obstetrics and Gynecology

## 2011-06-01 MED ORDER — IBUPROFEN 600 MG PO TABS: 600.0000 mg | ORAL_TABLET | Freq: Four times a day (QID) | ORAL | Status: AC | PRN

## 2011-06-01 MED ORDER — OXYCODONE-ACETAMINOPHEN 5-325 MG PO TABS: 1.0000 | ORAL_TABLET | ORAL | Status: AC | PRN

## 2011-06-01 NOTE — Discharge Summary (Signed)
  Obstetric Discharge Summary Reason for Admission: cesarean section for previa and hx myomectomy Prenatal Procedures: ultrasound Intrapartum Procedures: cesarean: low cervical, transverse Postpartum Procedures: none Complications-Operative and Postpartum: none  Temp:  [97.8 F (36.6 C)-98.4 F (36.9 C)] 98 F (36.7 C) (11/05 0625) Pulse Rate:  [72-80] 73  (11/05 0625) Resp:  [18] 18  (11/05 0625) BP: (106-112)/(70-73) 112/72 mmHg (11/05 0625) Hemoglobin  Date Value Range Status  05/30/2011 10.4* 12.0-15.0 (g/dL) Final     HCT  Date Value Range Status  05/30/2011 30.5* 36.0-46.0 (%) Final    Hospital Course:  Admitted on 05/29/11 for primary C/S due to previa and hx myomectomy.  Primary LTCS performed by Dr. Pennie Rushing, without complication.  Patient and infant subsequently did well, with normal post-operative course.. Patient was breastfeeding.  Hgb was stable, incision was healing well, and the patient was tolerating a regular diet without difficulty.  Her pain was well controlled on po meds, and by post-op day 3, she was deemed to have received the full benefit of her hospital stay and was discharged home in stable condition.  She was undecided regarding contraception.  Discharge Diagnoses: Placenta previa, Hx myomectomy, Primary low-transverse C/S  Discharge Information: Date: 06/01/2011 Activity: Per [facility] handout Diet: routine Medications:  Medication List  As of 06/01/2011  8:39 AM   START taking these medications         ibuprofen 600 MG tablet   Commonly known as: ADVIL,MOTRIN   Take 1 tablet (600 mg total) by mouth every 6 (six) hours as needed for pain.      oxyCODONE-acetaminophen 5-325 MG per tablet   Commonly known as: PERCOCET   Take 1-2 tablets by mouth every 3 (three) hours as needed (moderate - severe pain).         CONTINUE taking these medications         PRENATAL VITAMINS PO         STOP taking these medications         prenatal vitamin w/FE, FA 27-1  MG Tabs          Where to get your medications    These are the prescriptions that you need to pick up.   You may get these medications from any pharmacy.         ibuprofen 600 MG tablet   oxyCODONE-acetaminophen 5-325 MG per tablet           Condition: stable Instructions: refer to practice specific booklet Discharge to: home Follow-up Information    Follow up with [facility]. (as scheduled or as needed)          Newborn Data: Live born  Information for the patient's newborn:  Shamia, Uppal [[id number]]  female ; APGAR 4/8 , ; weight 6+8;  Home with mother.  Nigel Bridgeman 06/01/2011, 8:39 AM

## 2011-06-15 ENCOUNTER — Ambulatory Visit (HOSPITAL_COMMUNITY)
Admission: RE | Admit: 2011-06-15 | Discharge: 2011-06-15 | Disposition: A | Discharge status: Home / Self Care | Payer: Managed Care, Other (non HMO) | Source: Ambulatory Visit | Attending: Obstetrics and Gynecology | Admitting: Obstetrics and Gynecology

## 2011-06-17 ENCOUNTER — Ambulatory Visit (HOSPITAL_COMMUNITY)
Admission: RE | Admit: 2011-06-17 | Discharge: 2011-06-17 | Disposition: A | Discharge status: Home / Self Care | Payer: Managed Care, Other (non HMO) | Source: Ambulatory Visit | Attending: Obstetrics and Gynecology | Admitting: Obstetrics and Gynecology

## 2011-12-08 ENCOUNTER — Ambulatory Visit: Payer: Self-pay | Admitting: Obstetrics and Gynecology

## 2011-12-11 ENCOUNTER — Ambulatory Visit (INDEPENDENT_AMBULATORY_CARE_PROVIDER_SITE_OTHER): Payer: Private Health Insurance - Indemnity | Admitting: Obstetrics and Gynecology

## 2011-12-11 ENCOUNTER — Encounter: Payer: Self-pay | Admitting: Obstetrics and Gynecology

## 2011-12-11 VITALS — BP 112/64 | Ht 67.0 in | Wt 226.0 lb

## 2011-12-11 DIAGNOSIS — Z87898 Personal history of other specified conditions: Secondary | ICD-10-CM

## 2011-12-11 DIAGNOSIS — Z124 Encounter for screening for malignant neoplasm of cervix: Secondary | ICD-10-CM

## 2011-12-11 DIAGNOSIS — Z8742 Personal history of other diseases of the female genital tract: Secondary | ICD-10-CM

## 2011-12-11 DIAGNOSIS — D219 Benign neoplasm of connective and other soft tissue, unspecified: Secondary | ICD-10-CM | POA: Insufficient documentation

## 2011-12-11 DIAGNOSIS — D259 Leiomyoma of uterus, unspecified: Secondary | ICD-10-CM

## 2011-12-11 NOTE — Progress Notes (Signed)
The patient reports:normal menses, no abnormal bleeding, pelvic pain or discharge  Contraception:Discuss birth control  Last mammogram: was normal 2010 Last pap: was normal May  2012  GC/Chlamydia cultures offered: declined HIV/RPR/HbsAg offered:  declined HSV 1 and 2 glycoprotein offered: declined  Menstrual cycle regular and monthly: Yes Menstrual flow normal: Yes  Urinary symptoms: none Normal bowel movements: Yes Reports abuse at home: No    Subjective:    Katie Holmes is a 41 y.o. female, G1P0101, who presents for an annual exam 6 mos after c-section for [facility]    History   Social History  . Marital Status: Single    Spouse Name: N/A    Number of Children: N/A  . Years of Education: N/A   Social History Main Topics  . Smoking status: Never Smoker   . Smokeless tobacco: Never Used  . Alcohol Use: No  . Drug Use: No  . Sexually Active: Yes    Birth Control/ Protection: None   Other Topics Concern  . None   Social History Narrative  . None    Menstrual cycle:   LMP: Patient's last menstrual period was 12/06/2011.           Cycle: Regular, monthly with normal flow and no severe dysmenorrha Has had only 3 cycles since delivery The following portions of the patient's history were reviewed and updated as appropriate: allergies, current medications, past family history, past medical history, past social history, past surgical history and problem list.  Review of Systems Pertinent items are noted in HPI. Breast:Negative for breast lump,nipple discharge or nipple retraction Gastrointestinal: Negative for abdominal pain, change in bowel habits or rectal bleeding Urinary:negative   Objective:    BP 112/64  Ht 5\' 7"  (1.702 m)  Wt 226 lb (102.513 kg)  BMI 35.40 kg/m2  LMP 12/06/2011  Breastfeeding? No    Weight:  Wt Readings from Last 1 Encounters:  12/11/11 226 lb (102.513 kg)          BMI: Body mass index is 35.40 kg/(m^2).  General Appearance: Alert,  appropriate appearance for age. No acute distress HEENT: Grossly normal Neck / Thyroid: Supple, no masses, nodes or enlargement Lungs: clear to auscultation bilaterally Back: No CVA tenderness Breast Exam: No masses or nodes.No dimpling, nipple retraction or discharge. Cardiovascular: Regular rate and rhythm. S1, S2, no murmur Gastrointestinal: Soft, non-tender, no masses or organomegaly Pelvic Exam: Vulva and vagina appear normal. Bimanual exam reveals normal uterus and adnexa. Rectovaginal: normal rectal, no masses Lymphatic Exam: Non-palpable nodes in neck, clavicular, axillary, or inguinal regions Skin: no rash or abnormalities Neurologic: Normal gait and speech, no tremor  Psychiatric: Alert and oriented, appropriate affect.   Wet Prep:not applicable Urinalysis:not applicable UPT: Not done   Assessment:    Normal gyn exam history of fibroids, s/p myomectomy  History of cryosurgery of cervix 2004   Plan:    Mammogram to be scheduled 6 mos after discontinuing nursing pap smear with reflex HPV return annually or prn Annual paps until 2019 STD screening: declined Contraception:Has script for BCPs but not sexually active right now      [facility] PMD

## 2011-12-11 NOTE — Progress Notes (Signed)
Addended by: Lanna Poche on: 12/11/2011 09:41 AM   Modules accepted: Orders

## 2011-12-16 LAB — PAP IG W/ RFLX HPV ASCU

## 2012-12-09 ENCOUNTER — Other Ambulatory Visit: Payer: Self-pay | Admitting: Obstetrics and Gynecology

## 2012-12-09 DIAGNOSIS — Z1231 Encounter for screening mammogram for malignant neoplasm of breast: Secondary | ICD-10-CM

## 2012-12-22 ENCOUNTER — Ambulatory Visit (HOSPITAL_COMMUNITY): Payer: Managed Care, Other (non HMO)

## 2013-01-04 ENCOUNTER — Ambulatory Visit (HOSPITAL_COMMUNITY)
Admission: RE | Admit: 2013-01-04 | Discharge: 2013-01-04 | Disposition: A | Discharge status: Home / Self Care | Payer: Managed Care, Other (non HMO) | Source: Ambulatory Visit | Attending: Obstetrics and Gynecology | Admitting: Obstetrics and Gynecology

## 2013-01-04 DIAGNOSIS — Z1231 Encounter for screening mammogram for malignant neoplasm of breast: Secondary | ICD-10-CM | POA: Insufficient documentation

## 2014-02-07 ENCOUNTER — Other Ambulatory Visit: Payer: Self-pay | Admitting: Obstetrics and Gynecology

## 2014-02-07 DIAGNOSIS — Z1231 Encounter for screening mammogram for malignant neoplasm of breast: Secondary | ICD-10-CM

## 2014-02-12 ENCOUNTER — Ambulatory Visit (HOSPITAL_COMMUNITY)
Admission: RE | Admit: 2014-02-12 | Discharge: 2014-02-12 | Disposition: A | Discharge status: Home / Self Care | Payer: Managed Care, Other (non HMO) | Source: Ambulatory Visit | Attending: Obstetrics and Gynecology | Admitting: Obstetrics and Gynecology

## 2014-02-12 DIAGNOSIS — Z1231 Encounter for screening mammogram for malignant neoplasm of breast: Secondary | ICD-10-CM | POA: Insufficient documentation

## 2014-05-28 ENCOUNTER — Encounter: Payer: Self-pay | Admitting: Obstetrics and Gynecology

## 2015-02-12 ENCOUNTER — Other Ambulatory Visit (HOSPITAL_COMMUNITY): Payer: Self-pay | Admitting: Obstetrics and Gynecology

## 2015-02-12 DIAGNOSIS — Z1231 Encounter for screening mammogram for malignant neoplasm of breast: Secondary | ICD-10-CM

## 2015-02-15 ENCOUNTER — Ambulatory Visit (HOSPITAL_COMMUNITY)
Admission: RE | Admit: 2015-02-15 | Discharge: 2015-02-15 | Disposition: A | Discharge status: Home / Self Care | Payer: Managed Care, Other (non HMO) | Source: Ambulatory Visit | Attending: Obstetrics and Gynecology | Admitting: Obstetrics and Gynecology

## 2015-02-15 DIAGNOSIS — Z1231 Encounter for screening mammogram for malignant neoplasm of breast: Secondary | ICD-10-CM | POA: Insufficient documentation

## 2018-06-02 ENCOUNTER — Encounter (INDEPENDENT_AMBULATORY_CARE_PROVIDER_SITE_OTHER): Payer: Self-pay

## 2018-06-14 ENCOUNTER — Encounter (INDEPENDENT_AMBULATORY_CARE_PROVIDER_SITE_OTHER): Payer: Self-pay | Admitting: Family Medicine

## 2018-06-14 ENCOUNTER — Ambulatory Visit (INDEPENDENT_AMBULATORY_CARE_PROVIDER_SITE_OTHER): Payer: 59 | Admitting: Family Medicine

## 2018-06-14 VITALS — BP 128/80 | HR 68 | Temp 98.2°F | Ht 66.0 in | Wt 256.0 lb

## 2018-06-14 DIAGNOSIS — R0602 Shortness of breath: Secondary | ICD-10-CM | POA: Diagnosis not present

## 2018-06-14 DIAGNOSIS — E559 Vitamin D deficiency, unspecified: Secondary | ICD-10-CM

## 2018-06-14 DIAGNOSIS — Z0289 Encounter for other administrative examinations: Secondary | ICD-10-CM

## 2018-06-14 DIAGNOSIS — Z1331 Encounter for screening for depression: Secondary | ICD-10-CM | POA: Diagnosis not present

## 2018-06-14 DIAGNOSIS — Z9189 Other specified personal risk factors, not elsewhere classified: Secondary | ICD-10-CM | POA: Diagnosis not present

## 2018-06-14 DIAGNOSIS — R5383 Other fatigue: Secondary | ICD-10-CM

## 2018-06-14 DIAGNOSIS — Z6841 Body Mass Index (BMI) 40.0 and over, adult: Secondary | ICD-10-CM

## 2018-06-15 LAB — COMPREHENSIVE METABOLIC PANEL
ALT: 14 IU/L (ref 0–32)
AST: 16 IU/L (ref 0–40)
Albumin/Globulin Ratio: 1.4 (ref 1.2–2.2)
Albumin: 4.2 g/dL (ref 3.5–5.5)
Alkaline Phosphatase: 79 IU/L (ref 39–117)
BUN/Creatinine Ratio: 10 (ref 9–23)
BUN: 9 mg/dL (ref 6–24)
Bilirubin Total: 0.4 mg/dL (ref 0.0–1.2)
CALCIUM: 9.2 mg/dL (ref 8.7–10.2)
CO2: 23 mmol/L (ref 20–29)
CREATININE: 0.91 mg/dL (ref 0.57–1.00)
Chloride: 103 mmol/L (ref 96–106)
GFR calc Af Amer: 86 mL/min/{1.73_m2} (ref >59)
GFR, EST NON AFRICAN AMERICAN: 74 mL/min/{1.73_m2} (ref >59)
GLOBULIN, TOTAL: 2.9 g/dL (ref 1.5–4.5)
GLUCOSE: 84 mg/dL (ref 65–99)
Potassium: 4.6 mmol/L (ref 3.5–5.2)
SODIUM: 141 mmol/L (ref 134–144)
Total Protein: 7.1 g/dL (ref 6.0–8.5)

## 2018-06-15 LAB — CBC WITH DIFFERENTIAL
BASOS: 1 %
Basophils Absolute: 0 10*3/uL (ref 0.0–0.2)
EOS (ABSOLUTE): 0.3 10*3/uL (ref 0.0–0.4)
EOS: 4 %
HEMATOCRIT: 42.7 % (ref 34.0–46.6)
HEMOGLOBIN: 14.2 g/dL (ref 11.1–15.9)
IMMATURE GRANS (ABS): 0 10*3/uL (ref 0.0–0.1)
Immature Granulocytes: 0 %
Lymphocytes Absolute: 1.6 10*3/uL (ref 0.7–3.1)
Lymphs: 24 %
MCH: 30.7 pg (ref 26.6–33.0)
MCHC: 33.3 g/dL (ref 31.5–35.7)
MCV: 92 fL (ref 79–97)
MONOCYTES: 7 %
Monocytes Absolute: 0.5 10*3/uL (ref 0.1–0.9)
NEUTROS ABS: 4.3 10*3/uL (ref 1.4–7.0)
NEUTROS PCT: 64 %
RBC: 4.62 x10E6/uL (ref 3.77–5.28)
RDW: 12.6 % (ref 12.3–15.4)
WBC: 6.6 10*3/uL (ref 3.4–10.8)

## 2018-06-15 LAB — LIPID PANEL WITH LDL/HDL RATIO
CHOLESTEROL TOTAL: 196 mg/dL (ref 100–199)
HDL: 54 mg/dL (ref >39)
LDL CALC: 123 mg/dL — AB (ref 0–99)
LDl/HDL Ratio: 2.3 ratio (ref 0.0–3.2)
TRIGLYCERIDES: 94 mg/dL (ref 0–149)
VLDL Cholesterol Cal: 19 mg/dL (ref 5–40)

## 2018-06-15 LAB — INSULIN, RANDOM: INSULIN: 12.4 u[IU]/mL (ref 2.6–24.9)

## 2018-06-15 LAB — T4, FREE: FREE T4: 1.42 ng/dL (ref 0.82–1.77)

## 2018-06-15 LAB — TSH: TSH: 1.79 u[IU]/mL (ref 0.450–4.500)

## 2018-06-15 LAB — T3: T3, Total: 195 ng/dL — ABNORMAL HIGH (ref 71–180)

## 2018-06-15 LAB — FOLATE: Folate: 8 ng/mL (ref >3.0)

## 2018-06-15 LAB — HEMOGLOBIN A1C
Est. average glucose Bld gHb Est-mCnc: 103 mg/dL
Hgb A1c MFr Bld: 5.2 % (ref 4.8–5.6)

## 2018-06-15 LAB — VITAMIN B12: Vitamin B-12: 437 pg/mL (ref 232–1245)

## 2018-06-15 LAB — VITAMIN D 25 HYDROXY (VIT D DEFICIENCY, FRACTURES): VIT D 25 HYDROXY: 31.3 ng/mL (ref 30.0–100.0)

## 2018-06-15 NOTE — Progress Notes (Signed)
.  Office: [phone]  /  Fax: [phone]   HPI:   Chief Complaint: OBESITY  Katie Holmes (MR# [id number]) is a 47 y.o. female who presents on 06/14/2018 for obesity evaluation and treatment. Current BMI is Body mass index is 41.32 kg/m.Marland Kitchen Katie Holmes has struggled with obesity for years and has been unsuccessful in either losing weight or maintaining long term weight loss. Katie Holmes attended our information session and states she is currently in the action stage of change and ready to dedicate time achieving and maintaining a healthier weight.   Katie Holmes states her family eats meals together she thinks her family will eat healthier with  her her desired weight loss is 81 lbs she started gaining weight in 2007 her heaviest weight ever was 256 lbs she has significant food cravings issues  she snacks frequently in the evenings she is frequently drinking liquids with calories she frequently makes poor food choices she struggles with emotional eating    Fatigue Katie Holmes feels her energy is lower than it should be. This has worsened with weight gain and has not worsened recently. Katie Holmes admits to daytime somnolence and  denies waking up still tired. Patient is at risk for obstructive sleep apnea. Patent has a history of symptoms of daytime fatigue and morning headache. Patient generally gets 6 hours of sleep per night, and states they generally have generally restful sleep. Snoring is not present. Apneic episodes are not present. Epworth Sleepiness Score is 3.  Katie on exertion Aadhya notes increasing shortness of breath with exercising and seems to be worsening over time with weight gain. She notes getting out of breath sooner with activity than she used to. This has not gotten worse recently. Kaeleen denies orthopnea.  Vitamin D Deficiency Katie Holmes has a diagnosis of vitamin D deficiency. She is on OTC Vit D, no recent labs. She notes fatigue and denies nausea, vomiting or muscle weakness.  Depression  Screen Katie Holmes's Food and Mood (modified PHQ-9) score was  Depression screen PHQ 2/9 06/14/2018  Decreased Interest 1  Down, Depressed, Hopeless 0  PHQ - 2 Score 1  Altered sleeping 0  Tired, decreased energy 3  Change in appetite 1  Feeling bad or failure about yourself  1  Trouble concentrating 0  Moving slowly or fidgety/restless 1  Suicidal thoughts 0  PHQ-9 Score 7  Difficult doing work/chores Not difficult at all   At risk for cardiovascular disease Katie Holmes is at a higher than average risk for cardiovascular disease due to obesity. She currently denies any chest pain.  ALLERGIES: No Known Allergies  MEDICATIONS: Current Outpatient Medications on File Prior to Visit  Medication Sig Dispense Refill  . levonorgestrel-ethinyl estradiol (AVIANE,ALESSE,LESSINA) 0.1-20 MG-MCG tablet Take 1 tablet by mouth daily.    . valACYclovir (VALTREX) 500 MG tablet Take 500 mg by mouth daily.    . Vitamin D, Cholecalciferol, 10 MCG (400 UNIT) CAPS Take 1 capsule by mouth daily.     No current facility-administered medications on file prior to visit.     PAST MEDICAL HISTORY: Past Medical History:  Diagnosis Date  . Allergy    seasonal  . Cystitis   . Fatigue   . Fractured toe   . Heart murmur    since childhood- no problems  . History of cryosurgery of cervix complicating pregnancy   . History of herpes simplex type 2 infection   . History of myomectomy   . Intermittent knee pain   . Joint pain   . Obesity  complicating pregnancy   . Palpitations     PAST SURGICAL HISTORY: Past Surgical History:  Procedure Laterality Date  . CESAREAN SECTION  05/29/2011   Procedure: CESAREAN SECTION;  Surgeon: Eldred Manges, MD;  Location: [facility];  Service: Gynecology;  Laterality: N/A;  Primary/placenta previa BMI 47  . eye surgery     10 years ago  . MYOMECTOMY  2011  . WISDOM TOOTH EXTRACTION  1989    SOCIAL HISTORY: Social History   Tobacco Use  . Smoking status: Never Smoker  .  Smokeless tobacco: Never Used  Substance Use Topics  . Alcohol use: No  . Drug use: No    FAMILY HISTORY: Family History  Problem Relation Age of Onset  . Hypertension Maternal Grandmother   . Diabetes Maternal Grandmother   . Stroke Maternal Grandmother   . Cancer Maternal Grandfather   . Alcohol abuse Maternal Grandfather   . Heart disease Paternal Grandmother   . Hypertension Paternal Grandmother   . Obesity Mother     ROS: Review of Systems  Constitutional: Positive for malaise/fatigue. Negative for weight loss.  HENT:       + Hay fever  Eyes:       + Wear glasses or contacts  Respiratory: Positive for cough and shortness of breath (with exertion).   Cardiovascular: Negative for chest pain and orthopnea.  Gastrointestinal: Negative for nausea and vomiting.  Musculoskeletal:       Negative muscle weakness  Skin:       + Dryness  Neurological: Positive for headaches (periodic).    PHYSICAL EXAM: Blood pressure 128/80, pulse 68, temperature 98.2 F (36.8 C), temperature source Oral, height 5\' 6"  (1.676 m), weight 256 lb (116.1 kg), last menstrual period 05/23/2018, SpO2 98 %. Body mass index is 41.32 kg/m. Physical Exam  Constitutional: She is oriented to person, place, and time. She appears well-developed and well-nourished.  HENT:  Head: Normocephalic and atraumatic.  Nose: Nose normal.  Eyes: EOM are normal.  Neck: Normal range of motion. Neck supple. No thyromegaly present.  Cardiovascular: Normal rate and regular rhythm.  Pulmonary/Chest: Effort normal. No respiratory distress.  Abdominal: Soft. There is no tenderness.  + Obesity  Musculoskeletal:  Range of Motion normal in all 4 extremities Trace edema noted in bilateral lower extremities  Neurological: She is alert and oriented to person, place, and time. Coordination normal.  Skin: Skin is warm and dry.  Psychiatric: She has a normal mood and affect. Her behavior is normal.  Vitals  reviewed.   RECENT LABS AND TESTS: BMET    Component Value Date/Time   NA 141 06/14/2018 1126   K 4.6 06/14/2018 1126   CL 103 06/14/2018 1126   CO2 23 06/14/2018 1126   GLUCOSE 84 06/14/2018 1126   BUN 9 06/14/2018 1126   CREATININE 0.91 06/14/2018 1126   CALCIUM 9.2 06/14/2018 1126   GFRNONAA 74 06/14/2018 1126   GFRAA 86 06/14/2018 1126   Lab Results  Component Value Date   HGBA1C 5.2 06/14/2018   Lab Results  Component Value Date   INSULIN 12.4 06/14/2018   CBC    Component Value Date/Time   WBC 6.6 06/14/2018 1126   WBC 12.3 (H) 05/30/2011 0548   RBC 4.62 06/14/2018 1126   RBC 3.36 (L) 05/30/2011 0548   HGB 14.2 06/14/2018 1126   HCT 42.7 06/14/2018 1126   PLT 108 (L) 05/30/2011 0548   MCV 92 06/14/2018 1126   MCH 30.7 06/14/2018 1126  MCH 31.0 05/30/2011 0548   MCHC 33.3 06/14/2018 1126   MCHC 34.1 05/30/2011 0548   RDW 12.6 06/14/2018 1126   LYMPHSABS 1.6 06/14/2018 1126   EOSABS 0.3 06/14/2018 1126   BASOSABS 0.0 06/14/2018 1126   Iron/TIBC/Ferritin/ %Sat No results found for: IRON, TIBC, FERRITIN, IRONPCTSAT Lipid Panel     Component Value Date/Time   CHOL 196 06/14/2018 1126   TRIG 94 06/14/2018 1126   HDL 54 06/14/2018 1126   LDLCALC 123 (H) 06/14/2018 1126   Hepatic Function Panel     Component Value Date/Time   PROT 7.1 06/14/2018 1126   ALBUMIN 4.2 06/14/2018 1126   AST 16 06/14/2018 1126   ALT 14 06/14/2018 1126   ALKPHOS 79 06/14/2018 1126   BILITOT 0.4 06/14/2018 1126      Component Value Date/Time   TSH 1.790 06/14/2018 1126   Vitamin D No recent labs  ECG  shows NSR with a rate of 73 BPM INDIRECT CALORIMETER done today shows a VO2 of 308 and a REE of 2144. Her calculated basal metabolic rate is 8756 thus her basal metabolic rate is better than expected.    ASSESSMENT AND PLAN: Other fatigue - Plan: EKG 12-Lead, Comprehensive metabolic panel, CBC With Differential, Hemoglobin A1c, Insulin, random, Lipid Panel With  LDL/HDL Ratio, Vitamin B12, Folate, T3, T4, free, TSH  SOB (shortness of breath) on exertion - Plan: Comprehensive metabolic panel, CBC With Differential, Hemoglobin A1c, Insulin, random, Lipid Panel With LDL/HDL Ratio, Vitamin B12, Folate, T3, T4, free, TSH  Vitamin D deficiency - Plan: VITAMIN D 25 Hydroxy (Vit-D Deficiency, Fractures)  Depression screening  At risk for heart disease  Class 3 severe obesity with serious comorbidity and body mass index (BMI) of 40.0 to 44.9 in adult, unspecified obesity type (HCC)  PLAN:  Fatigue Katie Holmes was informed that her fatigue may be related to obesity, depression or many other causes. Labs will be ordered, and in the meanwhile Katie Holmes has agreed to work on diet, exercise and weight loss to help with fatigue. Proper sleep hygiene was discussed including the need for 7-8 hours of quality sleep each night. A sleep study was not ordered based on symptoms and Epworth score.  Katie on exertion Katie Holmes's shortness of breath appears to be obesity related and exercise induced. She has agreed to work on weight loss and gradually increase exercise to treat her exercise induced shortness of breath. If Ajah follows our instructions and loses weight without improvement of her shortness of breath, we will plan to refer to pulmonology. We will monitor this condition regularly. Katie Holmes agrees to this plan.  Vitamin D Deficiency Katie Holmes was informed that low vitamin D levels contributes to fatigue and are associated with obesity, breast, and colon cancer. Katie Holmes agrees to continue taking OTC Vit D and will follow up for routine testing of vitamin D, at least 2-3 times per year. She was informed of the risk of over-replacement of vitamin D and agrees to not increase her dose unless she discusses this with [place] first. We will check labs and Timber agrees to follow up with our clinic in 2 weeks.  Depression Screen Katie Holmes had a mildly positive depression screening. Depression is commonly  associated with obesity and often results in emotional eating behaviors. We will monitor this closely and work on CBT to help improve the non-hunger eating patterns. Referral to Psychology may be required if no improvement is seen as she continues in our clinic.  Cardiovascular risk counselling Katie Holmes was given  extended (15 minutes) coronary artery disease prevention counseling today. She is 47 y.o. female and has risk factors for heart disease including obesity. We discussed intensive lifestyle modifications today with an emphasis on specific weight loss instructions and strategies. Pt was also informed of the importance of increasing exercise and decreasing saturated fats to help prevent heart disease.  Obesity Katie Holmes is currently in the action stage of change and her goal is to continue with weight loss efforts She has agreed to follow the Category 3 plan Katie Holmes has been instructed to work up to a goal of 150 minutes of combined cardio and strengthening exercise per week for weight loss and overall health benefits. We discussed the following Behavioral Modification Strategies today: increasing lean protein intake, decreasing simple carbohydrates  and holiday eating strategies   Katie Holmes has agreed to follow up with our clinic in 2 weeks. She was informed of the importance of frequent follow up visits to maximize her success with intensive lifestyle modifications for her multiple health conditions. She was informed we would discuss her lab results at her next visit unless there is a critical issue that needs to be addressed sooner. Katie Holmes agreed to keep her next visit at the agreed upon time to discuss these results.    OBESITY BEHAVIORAL INTERVENTION VISIT  Today's visit was # 1   Starting weight: 256 lbs Starting date: 06/14/18 Today's weight : 256 lbs  Today's date: 06/14/2018 Total lbs lost to date: 0    ASK: We discussed the diagnosis of obesity with Katie Holmes today and Keauna agreed to give  [place] permission to discuss obesity behavioral modification therapy today.  ASSESS: Jontavia has the diagnosis of obesity and her BMI today is 41.34 Benedetta is in the action stage of change   ADVISE: Graceann was educated on the multiple health risks of obesity as well as the benefit of weight loss to improve her health. She was advised of the need for long term treatment and the importance of lifestyle modifications to improve her current health and to decrease her risk of future health problems.  AGREE: Multiple dietary modification options and treatment options were discussed and  Isra agreed to follow the recommendations documented in the above note.  ARRANGE: Hartlee was educated on the importance of frequent visits to treat obesity as outlined per CMS and USPSTF guidelines and agreed to schedule her next follow up appointment today.   I, Trixie Dredge, am acting as transcriptionist for Dennard Nip, MD  I have reviewed the above documentation for accuracy and completeness, and I agree with the above. -Dennard Nip, MD

## 2018-06-29 ENCOUNTER — Ambulatory Visit (INDEPENDENT_AMBULATORY_CARE_PROVIDER_SITE_OTHER): Payer: 59 | Admitting: Family Medicine

## 2018-06-29 VITALS — BP 124/79 | HR 71 | Temp 98.1°F | Ht 66.0 in | Wt 254.0 lb

## 2018-06-29 DIAGNOSIS — E88819 Insulin resistance, unspecified: Secondary | ICD-10-CM

## 2018-06-29 DIAGNOSIS — E66813 Obesity, class 3: Secondary | ICD-10-CM

## 2018-06-29 DIAGNOSIS — E7849 Other hyperlipidemia: Secondary | ICD-10-CM

## 2018-06-29 DIAGNOSIS — Z6841 Body Mass Index (BMI) 40.0 and over, adult: Secondary | ICD-10-CM

## 2018-06-29 DIAGNOSIS — E559 Vitamin D deficiency, unspecified: Secondary | ICD-10-CM

## 2018-06-29 DIAGNOSIS — Z9189 Other specified personal risk factors, not elsewhere classified: Secondary | ICD-10-CM

## 2018-06-29 DIAGNOSIS — E8881 Metabolic syndrome: Secondary | ICD-10-CM

## 2018-06-29 MED ORDER — VITAMIN D (ERGOCALCIFEROL) 1.25 MG (50000 UNIT) PO CAPS: 50000.0000 [IU] | ORAL_CAPSULE | ORAL | 0 refills | Status: DC

## 2018-07-04 NOTE — Progress Notes (Signed)
Office: [phone]  /  Fax: [phone]   HPI:   Chief Complaint: OBESITY Katie Holmes is here to discuss her progress with her obesity treatment plan. She is on the Category 3 plan and is following her eating plan approximately 75 % of the time. She states she is exercising 0 minutes 0 times per week. Katie Holmes did well on her Category 3 plan and managed to portion control and smarter choices over Thanksgiving. She notes hunger was controlled, but cravings were a problem.  Her weight is 254 lb (115.2 kg) today and has had a weight loss of 2 pounds over a period of 2 weeks since her last visit. She has lost 2 lbs since starting treatment with [place].  Hyperlipidemia Katie Holmes has hyperlipidemia and has been trying to improve her cholesterol levels with intensive lifestyle modification including a low saturated fat diet, exercise and weight loss. Katie Holmes's LDL is elevated at 123 on 06/14/18. She does have a family history of hyperlipidemia. She denies any chest pain or myalgias.  Vitamin D deficiency Katie Holmes has a diagnosis of vitamin D deficiency. She is currently taking OTC vit D at a low dose of 400 units and her level is not yet at goal. She denies nausea, vomiting, or muscle weakness.  Insulin Resistance Katie Holmes has a new diagnosis of insulin resistance with a normal A1c and glucose, but has an increased fasting Insulin. Although Katie Holmes's blood glucose readings are still under good control, insulin resistance puts her at greater risk of metabolic syndrome and diabetes. Her polyphagia has improved with reducing simple carbs. She is not taking metformin currently and continues to work on diet and exercise to decrease risk of diabetes. She denies hypoglycemia.  At risk for diabetes Katie Holmes is at higher than average risk for developing diabetes due to her insulin resistance and obesity. She currently denies polyuria or polydipsia.  ALLERGIES: No Known Allergies  MEDICATIONS: Current Outpatient Medications on File Prior  to Visit  Medication Sig Dispense Refill  . levonorgestrel-ethinyl estradiol (AVIANE,ALESSE,LESSINA) 0.1-20 MG-MCG tablet Take 1 tablet by mouth daily.    . valACYclovir (VALTREX) 500 MG tablet Take 500 mg by mouth daily.    . Vitamin D, Cholecalciferol, 10 MCG (400 UNIT) CAPS Take 1 capsule by mouth daily.     No current facility-administered medications on file prior to visit.     PAST MEDICAL HISTORY: Past Medical History:  Diagnosis Date  . Allergy    seasonal  . Cystitis   . Fatigue   . Fractured toe   . Heart murmur    since childhood- no problems  . History of cryosurgery of cervix complicating pregnancy   . History of herpes simplex type 2 infection   . History of myomectomy   . Intermittent knee pain   . Joint pain   . Obesity complicating pregnancy   . Palpitations     PAST SURGICAL HISTORY: Past Surgical History:  Procedure Laterality Date  . CESAREAN SECTION  05/29/2011   Procedure: CESAREAN SECTION;  Surgeon: Eldred Manges, MD;  Location: [facility];  Service: Gynecology;  Laterality: N/A;  Primary/placenta previa BMI 47  . eye surgery     10 years ago  . MYOMECTOMY  2011  . WISDOM TOOTH EXTRACTION  1989    SOCIAL HISTORY: Social History   Tobacco Use  . Smoking status: Never Smoker  . Smokeless tobacco: Never Used  Substance Use Topics  . Alcohol use: No  . Drug use: No    FAMILY  HISTORY: Family History  Problem Relation Age of Onset  . Hypertension Maternal Grandmother   . Diabetes Maternal Grandmother   . Stroke Maternal Grandmother   . Cancer Maternal Grandfather   . Alcohol abuse Maternal Grandfather   . Heart disease Paternal Grandmother   . Hypertension Paternal Grandmother   . Obesity Mother     ROS: Review of Systems  Constitutional: Positive for weight loss.  Cardiovascular: Negative for chest pain.  Gastrointestinal: Negative for nausea and vomiting.  Genitourinary:       Negative for polyuria.  Musculoskeletal: Negative for  myalgias.       Negative for muscle weakness.  Endo/Heme/Allergies: Negative for polydipsia.       Positive for polyphagia. Negative for hypoglycemia.    PHYSICAL EXAM: Blood pressure 124/79, pulse 71, temperature 98.1 F (36.7 C), temperature source Oral, height 5\' 6"  (1.676 m), weight 254 lb (115.2 kg), SpO2 99 %. Body mass index is 41 kg/m. Physical Exam  Constitutional: She is oriented to person, place, and time. She appears well-developed and well-nourished.  Cardiovascular: Normal rate.  Pulmonary/Chest: Effort normal.  Musculoskeletal: Normal range of motion.  Neurological: She is oriented to person, place, and time.  Skin: Skin is warm and dry.  Psychiatric: She has a normal mood and affect. Her behavior is normal.  Vitals reviewed.   RECENT LABS AND TESTS: BMET    Component Value Date/Time   NA 141 06/14/2018 1126   K 4.6 06/14/2018 1126   CL 103 06/14/2018 1126   CO2 23 06/14/2018 1126   GLUCOSE 84 06/14/2018 1126   BUN 9 06/14/2018 1126   CREATININE 0.91 06/14/2018 1126   CALCIUM 9.2 06/14/2018 1126   GFRNONAA 74 06/14/2018 1126   GFRAA 86 06/14/2018 1126   Lab Results  Component Value Date   HGBA1C 5.2 06/14/2018   Lab Results  Component Value Date   INSULIN 12.4 06/14/2018   CBC    Component Value Date/Time   WBC 6.6 06/14/2018 1126   WBC 12.3 (H) 05/30/2011 0548   RBC 4.62 06/14/2018 1126   RBC 3.36 (L) 05/30/2011 0548   HGB 14.2 06/14/2018 1126   HCT 42.7 06/14/2018 1126   PLT 108 (L) 05/30/2011 0548   MCV 92 06/14/2018 1126   MCH 30.7 06/14/2018 1126   MCH 31.0 05/30/2011 0548   MCHC 33.3 06/14/2018 1126   MCHC 34.1 05/30/2011 0548   RDW 12.6 06/14/2018 1126   LYMPHSABS 1.6 06/14/2018 1126   EOSABS 0.3 06/14/2018 1126   BASOSABS 0.0 06/14/2018 1126   Iron/TIBC/Ferritin/ %Sat No results found for: IRON, TIBC, FERRITIN, IRONPCTSAT Lipid Panel     Component Value Date/Time   CHOL 196 06/14/2018 1126   TRIG 94 06/14/2018 1126   HDL  54 06/14/2018 1126   LDLCALC 123 (H) 06/14/2018 1126   Hepatic Function Panel     Component Value Date/Time   PROT 7.1 06/14/2018 1126   ALBUMIN 4.2 06/14/2018 1126   AST 16 06/14/2018 1126   ALT 14 06/14/2018 1126   ALKPHOS 79 06/14/2018 1126   BILITOT 0.4 06/14/2018 1126      Component Value Date/Time   TSH 1.790 06/14/2018 1126   Results for JEANNIFER, DRAKEFORD (MRN [id number]) as of 07/04/2018 07:08  Ref. Range 06/14/2018 11:26  Vitamin D, 25-Hydroxy Latest Ref Range: 30.0 - 100.0 ng/mL 31.3   ASSESSMENT AND PLAN: Other hyperlipidemia  Vitamin D deficiency - Plan: Vitamin D, Ergocalciferol, (DRISDOL) 1.25 MG (50000 UT) CAPS capsule  Insulin  resistance  At risk for diabetes mellitus  Class 3 severe obesity with serious comorbidity and body mass index (BMI) of 40.0 to 44.9 in adult, unspecified obesity type ([place])  PLAN:  Hyperlipidemia Katie Holmes was informed of the American Heart Association Guidelines emphasizing intensive lifestyle modifications as the first line treatment for hyperlipidemia. We discussed many lifestyle modifications today in depth, and Shakevia will continue to work on decreasing saturated fats such as fatty red meat, butter and many fried foods. She will also increase vegetables and lean protein in her diet and continue to work on exercise and weight loss efforts. Katie Holmes agrees to continue with her diet and exercise. We will check her labs in 3 months and Katie Holmes agrees to follow up in 2 weeks.  Vitamin D Deficiency Katie Holmes was informed that low vitamin D levels contributes to fatigue and are associated with obesity, breast, and colon cancer. She agrees to start to take prescription Vit D @50 ,000 IU every week #4 with no refills and hold off on taking OTC vitamin D. She will follow up for routine testing of vitamin D, at least 2-3 times per year. She was informed of the risk of over-replacement of vitamin D and agrees to not increase her dose unless she discusses this with [place]  first. We will check labs in 3 months and Katie Holmes agrees to follow up in 2 weeks.  Insulin Resistance Katie Holmes will continue to work on weight loss, exercise, and decreasing simple carbohydrates in her diet to help decrease the risk of diabetes. She was informed that eating too many simple carbohydrates or too many calories at one sitting increases the likelihood of GI side effects. She agrees to continue with her diet and exercise and will recheck labs in 3 months. Katie Holmes agreed to follow up with [place] as directed to monitor her progress.  Diabetes risk counseling Katie Holmes was given extended (30 minutes) diabetes prevention counseling today. She is 46 y.o. female and has risk factors for diabetes including insulin resistance and obesity. We discussed intensive lifestyle modifications today with an emphasis on weight loss as well as increasing exercise and decreasing simple carbohydrates in her diet.  Obesity Katie Holmes is currently in the action stage of change. As such, her goal is to continue with weight loss efforts. She has agreed to follow the Category 3 plan. Justyce has been instructed to work up to a goal of 150 minutes of combined cardio and strengthening exercise per week for weight loss and overall health benefits. We discussed the following Behavioral Modification Strategies today: increasing lean protein intake, decreasing simple carbohydrates, work on meal planning, and easy cooking plans and holiday eating strategies.   Pearley has agreed to follow up with our clinic in 2 weeks. She was informed of the importance of frequent follow up visits to maximize her success with intensive lifestyle modifications for her multiple health conditions.   OBESITY BEHAVIORAL INTERVENTION VISIT  Today's visit was # 2   Starting weight: 256 lbs Starting date: 06/14/18 Today's weight : Weight: 254 lb (115.2 kg)  Today's date: 06/29/2018 Total lbs lost to date: 2  ASK: We discussed the diagnosis of obesity with Reinaldo Berber today and Erienne agreed to give [place] permission to discuss obesity behavioral modification therapy today.  ASSESS: Chalsea has the diagnosis of obesity and her BMI today is 41.02. Izabel is in the action stage of change.   ADVISE: Aariona was educated on the multiple health risks of obesity as well as the benefit of  weight loss to improve her health. She was advised of the need for long term treatment and the importance of lifestyle modifications to improve her current health and to decrease her risk of future health problems.  AGREE: Multiple dietary modification options and treatment options were discussed and Noretta agreed to follow the recommendations documented in the above note.  ARRANGE: Blinda was educated on the importance of frequent visits to treat obesity as outlined per CMS and USPSTF guidelines and agreed to schedule her next follow up appointment today.  I, Marcille Blanco, am acting as transcriptionist for Starlyn Skeans, MD  I have reviewed the above documentation for accuracy and completeness, and I agree with the above. -Dennard Nip, MD

## 2018-07-13 ENCOUNTER — Ambulatory Visit (INDEPENDENT_AMBULATORY_CARE_PROVIDER_SITE_OTHER): Payer: 59 | Admitting: Family Medicine

## 2018-07-13 ENCOUNTER — Encounter (INDEPENDENT_AMBULATORY_CARE_PROVIDER_SITE_OTHER): Payer: Self-pay | Admitting: Family Medicine

## 2018-07-13 VITALS — BP 111/66 | HR 73 | Temp 97.7°F | Ht 66.0 in | Wt 253.0 lb

## 2018-07-13 DIAGNOSIS — Z6841 Body Mass Index (BMI) 40.0 and over, adult: Secondary | ICD-10-CM

## 2018-07-13 DIAGNOSIS — Z9189 Other specified personal risk factors, not elsewhere classified: Secondary | ICD-10-CM

## 2018-07-13 DIAGNOSIS — E559 Vitamin D deficiency, unspecified: Secondary | ICD-10-CM

## 2018-07-13 MED ORDER — VITAMIN D (ERGOCALCIFEROL) 1.25 MG (50000 UNIT) PO CAPS: 50000.0000 [IU] | ORAL_CAPSULE | ORAL | 0 refills | Status: DC

## 2018-07-14 NOTE — Progress Notes (Signed)
Office: [phone]  /  Fax: [phone]   HPI:   Chief Complaint: OBESITY Katie Holmes is here to discuss her progress with her obesity treatment plan. She is on the Category 3 plan and is following her eating plan approximately 70 % of the time. She states she is exercising 0 minutes 0 times per week. Katie Holmes was on vacation in [place] and did well being mindful and planning breakfast ahead of time, as well as increased walking. She feels Christmas will not be much of a temptation.  Her weight is 253 lb (114.8 kg) today and has had a weight loss of 1 pound over a period of 2 weeks since her last visit. She has lost 2 lbs since starting treatment with [place].  Vitamin D deficiency Katie Holmes has a diagnosis of vitamin D deficiency. She is currently stable on vit D, but is not yet at goal. She denies nausea, vomiting, or muscle weakness.  At risk for osteopenia and osteoporosis Katie Holmes is at higher risk of osteopenia and osteoporosis due to vitamin D deficiency.   ASSESSMENT AND PLAN:  Vitamin D deficiency - Plan: Vitamin D, Ergocalciferol, (DRISDOL) 1.25 MG (50000 UT) CAPS capsule  At risk for osteoporosis  Class 3 severe obesity with serious comorbidity and body mass index (BMI) of 40.0 to 44.9 in adult, unspecified obesity type ([place])  PLAN:  Vitamin D Deficiency Katie Holmes was informed that low vitamin D levels contributes to fatigue and are associated with obesity, breast, and colon cancer. She agrees to continue to take prescription Vit D @50 ,000 IU every week #4 with no refills and will follow up for routine testing of vitamin D, at least 2-3 times per year. She was informed of the risk of over-replacement of vitamin D and agrees to not increase her dose unless she discusses this with [place] first. Katie Holmes agrees to follow up in 3 to 4 weeks.  At risk for osteopenia and osteoporosis Katie Holmes was given extended (15 minutes) osteoporosis prevention counseling today. Katie Holmes is at risk for osteopenia and  osteoporosis due to her vitamin D deficiency. She was encouraged to take her vitamin D and follow her higher calcium diet and increase strengthening exercise to help strengthen her bones and decrease her risk of osteopenia and osteoporosis.  Obesity Katie Holmes is currently in the action stage of change. As such, her goal is to continue with weight loss efforts. She has agreed to follow the Category 3 plan. Katie Holmes has been instructed to work up to a goal of 150 minutes of combined cardio and strengthening exercise per week for weight loss and overall health benefits. We discussed the following Behavioral Modification Strategies today: increasing lean protein intake, decreasing simple carbohydrates, holiday eating strategies, and celebration eating strategies.  Katie Holmes has agreed to follow up with our clinic in 3 to 4 weeks. She was informed of the importance of frequent follow up visits to maximize her success with intensive lifestyle modifications for her multiple health conditions.  ALLERGIES: No Known Allergies  MEDICATIONS: Current Outpatient Medications on File Prior to Visit  Medication Sig Dispense Refill  . levonorgestrel-ethinyl estradiol (AVIANE,ALESSE,LESSINA) 0.1-20 MG-MCG tablet Take 1 tablet by mouth daily.    . valACYclovir (VALTREX) 500 MG tablet Take 500 mg by mouth daily.    . Vitamin D, Cholecalciferol, 10 MCG (400 UNIT) CAPS Take 1 capsule by mouth daily.     No current facility-administered medications on file prior to visit.     PAST MEDICAL HISTORY: Past Medical History:  Diagnosis Date  . Allergy    seasonal  . Cystitis   . Fatigue   . Fractured toe   . Heart murmur    since childhood- no problems  . History of cryosurgery of cervix complicating pregnancy   . History of herpes simplex type 2 infection   . History of myomectomy   . Intermittent knee pain   . Joint pain   . Obesity complicating pregnancy   . Palpitations     PAST SURGICAL HISTORY: Past Surgical  History:  Procedure Laterality Date  . CESAREAN SECTION  05/29/2011   Procedure: CESAREAN SECTION;  Surgeon: Eldred Manges, MD;  Location: [facility];  Service: Gynecology;  Laterality: N/A;  Primary/placenta previa BMI 47  . eye surgery     10 years ago  . MYOMECTOMY  2011  . WISDOM TOOTH EXTRACTION  1989    SOCIAL HISTORY: Social History   Tobacco Use  . Smoking status: Never Smoker  . Smokeless tobacco: Never Used  Substance Use Topics  . Alcohol use: No  . Drug use: No    FAMILY HISTORY: Family History  Problem Relation Age of Onset  . Hypertension Maternal Grandmother   . Diabetes Maternal Grandmother   . Stroke Maternal Grandmother   . Cancer Maternal Grandfather   . Alcohol abuse Maternal Grandfather   . Heart disease Paternal Grandmother   . Hypertension Paternal Grandmother   . Obesity Mother     ROS: Review of Systems  Constitutional: Positive for weight loss.  Gastrointestinal: Negative for nausea and vomiting.  Musculoskeletal:       Negative for muscle weakness.    PHYSICAL EXAM: Blood pressure 111/66, pulse 73, temperature 97.7 F (36.5 C), temperature source Oral, height 5\' 6"  (1.676 m), weight 253 lb (114.8 kg), SpO2 96 %. Body mass index is 40.84 kg/m. Physical Exam Vitals signs reviewed.  Constitutional:      Appearance: Normal appearance. She is obese.  Cardiovascular:     Rate and Rhythm: Normal rate.  Pulmonary:     Effort: Pulmonary effort is normal.  Musculoskeletal: Normal range of motion.  Skin:    General: Skin is warm and dry.  Neurological:     Mental Status: She is alert and oriented to person, place, and time.  Psychiatric:        Mood and Affect: Mood normal.        Behavior: Behavior normal.     RECENT LABS AND TESTS: BMET    Component Value Date/Time   NA 141 06/14/2018 1126   K 4.6 06/14/2018 1126   CL 103 06/14/2018 1126   CO2 23 06/14/2018 1126   GLUCOSE 84 06/14/2018 1126   BUN 9 06/14/2018 1126    CREATININE 0.91 06/14/2018 1126   CALCIUM 9.2 06/14/2018 1126   GFRNONAA 74 06/14/2018 1126   GFRAA 86 06/14/2018 1126   Lab Results  Component Value Date   HGBA1C 5.2 06/14/2018   Lab Results  Component Value Date   INSULIN 12.4 06/14/2018   CBC    Component Value Date/Time   WBC 6.6 06/14/2018 1126   WBC 12.3 (H) 05/30/2011 0548   RBC 4.62 06/14/2018 1126   RBC 3.36 (L) 05/30/2011 0548   HGB 14.2 06/14/2018 1126   HCT 42.7 06/14/2018 1126   PLT 108 (L) 05/30/2011 0548   MCV 92 06/14/2018 1126   MCH 30.7 06/14/2018 1126   MCH 31.0 05/30/2011 0548   MCHC 33.3 06/14/2018 1126   MCHC 34.1  05/30/2011 0548   RDW 12.6 06/14/2018 1126   LYMPHSABS 1.6 06/14/2018 1126   EOSABS 0.3 06/14/2018 1126   BASOSABS 0.0 06/14/2018 1126   Iron/TIBC/Ferritin/ %Sat No results found for: IRON, TIBC, FERRITIN, IRONPCTSAT Lipid Panel     Component Value Date/Time   CHOL 196 06/14/2018 1126   TRIG 94 06/14/2018 1126   HDL 54 06/14/2018 1126   LDLCALC 123 (H) 06/14/2018 1126   Hepatic Function Panel     Component Value Date/Time   PROT 7.1 06/14/2018 1126   ALBUMIN 4.2 06/14/2018 1126   AST 16 06/14/2018 1126   ALT 14 06/14/2018 1126   ALKPHOS 79 06/14/2018 1126   BILITOT 0.4 06/14/2018 1126      Component Value Date/Time   TSH 1.790 06/14/2018 1126   Results for ANABELA, CRAYTON (MRN [id number]) as of 07/14/2018 08:25  Ref. Range 06/14/2018 11:26  Vitamin D, 25-Hydroxy Latest Ref Range: 30.0 - 100.0 ng/mL 31.3    OBESITY BEHAVIORAL INTERVENTION VISIT  Today's visit was # 3   Starting weight: 256 lbs Starting date: 06/14/18 Today's weight : Weight: 253 lb (114.8 kg)  Today's date: 07/13/2018 Total lbs lost to date: 3  ASK: We discussed the diagnosis of obesity with Reinaldo Berber today and Clarise Cruz agreed to give [place] permission to discuss obesity behavioral modification therapy today.  ASSESS: Tymira has the diagnosis of obesity and her BMI today is 40.8. Olga is in the action  stage of change.   ADVISE: Breniya was educated on the multiple health risks of obesity as well as the benefit of weight loss to improve her health. She was advised of the need for long term treatment and the importance of lifestyle modifications to improve her current health and to decrease her risk of future health problems.  AGREE: Multiple dietary modification options and treatment options were discussed and Keeley agreed to follow the recommendations documented in the above note.  ARRANGE: Griselda was educated on the importance of frequent visits to treat obesity as outlined per CMS and USPSTF guidelines and agreed to schedule her next follow up appointment today.  I, Marcille Blanco, am acting as transcriptionist for Starlyn Skeans, MD  I have reviewed the above documentation for accuracy and completeness, and I agree with the above. -Dennard Nip, MD

## 2018-08-09 ENCOUNTER — Ambulatory Visit (INDEPENDENT_AMBULATORY_CARE_PROVIDER_SITE_OTHER): Payer: 59 | Admitting: Family Medicine

## 2018-08-09 ENCOUNTER — Encounter (INDEPENDENT_AMBULATORY_CARE_PROVIDER_SITE_OTHER): Payer: Self-pay | Admitting: Family Medicine

## 2018-08-09 VITALS — BP 118/82 | HR 74 | Temp 98.1°F | Ht 66.0 in | Wt 252.0 lb

## 2018-08-09 DIAGNOSIS — Z6841 Body Mass Index (BMI) 40.0 and over, adult: Secondary | ICD-10-CM | POA: Diagnosis not present

## 2018-08-09 DIAGNOSIS — Z9189 Other specified personal risk factors, not elsewhere classified: Secondary | ICD-10-CM

## 2018-08-09 DIAGNOSIS — E559 Vitamin D deficiency, unspecified: Secondary | ICD-10-CM | POA: Diagnosis not present

## 2018-08-09 DIAGNOSIS — F3289 Other specified depressive episodes: Secondary | ICD-10-CM | POA: Diagnosis not present

## 2018-08-09 MED ORDER — BUPROPION HCL ER (SR) 150 MG PO TB12: 150.0000 mg | ORAL_TABLET | Freq: Every day | ORAL | 0 refills | Status: DC

## 2018-08-10 NOTE — Progress Notes (Signed)
Office: [phone]  /  Fax: [phone]   HPI:   Chief Complaint: OBESITY Katie Holmes is here to discuss her progress with her obesity treatment plan. She is on the Category 3 plan and is following her eating plan approximately 80 % of the time. She states she walked 3 miles 3 times per week. Katie Holmes continues to do well with weight loss even over the holidays, but she has been struggling with increased PM cravings for the last 2 weeks. She notes it is worse since indulging a bit over the holidays.  Her weight is 252 lb (114.3 kg) today and has had a weight loss of 1 pound over a period of 4 weeks since her last visit. She has lost 4 lbs since starting treatment with [place].  Vitamin D Deficiency Katie Holmes has a diagnosis of vitamin D deficiency. She is stable on prescription Vit D, but level is not yet at goal. She denies nausea, vomiting or muscle weakness.  Depression with emotional eating behaviors Katie Holmes notes increased emotional eating, worse in the PM. She feels out of control at times. She is using food for comfort to the extent that it is negatively impacting her health. She often snacks when she is not hungry. Katie Holmes sometimes feels she is out of control and then feels guilty that she made poor food choices. She has been working on behavior modification techniques to help reduce her emotional eating and has been somewhat successful. She shows no sign of suicidal or homicidal ideations.  Depression screen PHQ 2/9 06/14/2018  Decreased Interest 1  Down, Depressed, Hopeless 0  PHQ - 2 Score 1  Altered sleeping 0  Tired, decreased energy 3  Change in appetite 1  Feeling bad or failure about yourself  1  Trouble concentrating 0  Moving slowly or fidgety/restless 1  Suicidal thoughts 0  PHQ-9 Score 7  Difficult doing work/chores Not difficult at all    At risk for cardiovascular disease Katie Holmes is at a higher than average risk for cardiovascular disease due to obesity. She currently denies any chest  pain.  ASSESSMENT AND PLAN:  Vitamin D deficiency  Other depression - with emotional eating - Plan: buPROPion (WELLBUTRIN SR) 150 MG 12 hr tablet  At risk for heart disease  Class 3 severe obesity with serious comorbidity and body mass index (BMI) of 40.0 to 44.9 in adult, unspecified obesity type ([place])  PLAN:  Vitamin D Deficiency Katie Holmes was informed that low vitamin D levels contributes to fatigue and are associated with obesity, breast, and colon cancer. Katie Holmes agrees to continue taking prescription Vit D @50 ,000 IU every week and will follow up for routine testing of vitamin D, at least 2-3 times per year. She was informed of the risk of over-replacement of vitamin D and agrees to not increase her dose unless she discusses this with [place] first. We will recheck labs in 2 months. Katie Holmes agrees to follow up with our clinic in 2 weeks.  Depression with Emotional Eating Behaviors We discussed behavior modification techniques today to help Katie Holmes deal with her emotional eating and depression. Katie Holmes agrees to start Wellbutrin SR 150 mg q AM #30 with no refills. [name] agrees to follow up with our clinic in 2 weeks.  Cardiovascular risk counselling Katie Holmes was given extended (15 minutes) coronary artery disease prevention counseling today. She is 48 y.o. female and has risk factors for heart disease including obesity. We discussed intensive lifestyle modifications today with an emphasis on specific weight loss instructions and  strategies. Pt was also informed of the importance of increasing exercise and decreasing saturated fats to help prevent heart disease.  Obesity Katie Holmes is currently in the action stage of change. As such, her goal is to continue with weight loss efforts She has agreed to follow the Category 3 plan Katie Holmes has been instructed to work up to a goal of 150 minutes of combined cardio and strengthening exercise per week for weight loss and overall health benefits. We discussed the following  Behavioral Modification Strategies today: increasing lean protein intake, work on meal planning and easy cooking plans and emotional eating strategies   Katie Holmes has agreed to follow up with our clinic in 2 weeks. She was informed of the importance of frequent follow up visits to maximize her success with intensive lifestyle modifications for her multiple health conditions.  ALLERGIES: No Known Allergies  MEDICATIONS: Current Outpatient Medications on File Prior to Visit  Medication Sig Dispense Refill  . levonorgestrel-ethinyl estradiol (AVIANE,ALESSE,LESSINA) 0.1-20 MG-MCG tablet Take 1 tablet by mouth daily.    . valACYclovir (VALTREX) 500 MG tablet Take 500 mg by mouth daily.    . Vitamin D, Ergocalciferol, (DRISDOL) 1.25 MG (50000 UT) CAPS capsule Take 1 capsule (50,000 Units total) by mouth every 7 (seven) days. 4 capsule 0   No current facility-administered medications on file prior to visit.     PAST MEDICAL HISTORY: Past Medical History:  Diagnosis Date  . Allergy    seasonal  . Cystitis   . Fatigue   . Fractured toe   . Heart murmur    since childhood- no problems  . History of cryosurgery of cervix complicating pregnancy   . History of herpes simplex type 2 infection   . History of myomectomy   . Intermittent knee pain   . Joint pain   . Obesity complicating pregnancy   . Palpitations     PAST SURGICAL HISTORY: Past Surgical History:  Procedure Laterality Date  . CESAREAN SECTION  05/29/2011   Procedure: CESAREAN SECTION;  Surgeon: Eldred Manges, MD;  Location: [facility];  Service: Gynecology;  Laterality: N/A;  Primary/placenta previa BMI 47  . eye surgery     10 years ago  . MYOMECTOMY  2011  . WISDOM TOOTH EXTRACTION  1989    SOCIAL HISTORY: Social History   Tobacco Use  . Smoking status: Never Smoker  . Smokeless tobacco: Never Used  Substance Use Topics  . Alcohol use: No  . Drug use: No    FAMILY HISTORY: Family History  Problem Relation Age  of Onset  . Hypertension Maternal Grandmother   . Diabetes Maternal Grandmother   . Stroke Maternal Grandmother   . Cancer Maternal Grandfather   . Alcohol abuse Maternal Grandfather   . Heart disease Paternal Grandmother   . Hypertension Paternal Grandmother   . Obesity Mother     ROS: Review of Systems  Constitutional: Positive for weight loss.  Cardiovascular: Negative for chest pain.  Gastrointestinal: Negative for nausea and vomiting.  Musculoskeletal:       Negative muscle weakness  Psychiatric/Behavioral: Positive for depression. Negative for suicidal ideas.    PHYSICAL EXAM: Blood pressure 118/82, pulse 74, temperature 98.1 F (36.7 C), temperature source Oral, height 5\' 6"  (1.676 m), weight 252 lb (114.3 kg), SpO2 99 %. Body mass index is 40.67 kg/m. Physical Exam Vitals signs reviewed.  Constitutional:      Appearance: Normal appearance. She is obese.  Cardiovascular:     Rate and Rhythm:  Normal rate.     Pulses: Normal pulses.  Pulmonary:     Effort: Pulmonary effort is normal.     Breath sounds: Normal breath sounds.  Musculoskeletal: Normal range of motion.  Skin:    General: Skin is warm and dry.  Neurological:     Mental Status: She is alert and oriented to person, place, and time.  Psychiatric:        Mood and Affect: Mood normal.        Behavior: Behavior normal.     RECENT LABS AND TESTS: BMET    Component Value Date/Time   NA 141 06/14/2018 1126   K 4.6 06/14/2018 1126   CL 103 06/14/2018 1126   CO2 23 06/14/2018 1126   GLUCOSE 84 06/14/2018 1126   BUN 9 06/14/2018 1126   CREATININE 0.91 06/14/2018 1126   CALCIUM 9.2 06/14/2018 1126   GFRNONAA 74 06/14/2018 1126   GFRAA 86 06/14/2018 1126   Lab Results  Component Value Date   HGBA1C 5.2 06/14/2018   Lab Results  Component Value Date   INSULIN 12.4 06/14/2018   CBC    Component Value Date/Time   WBC 6.6 06/14/2018 1126   WBC 12.3 (H) 05/30/2011 0548   RBC 4.62 06/14/2018  1126   RBC 3.36 (L) 05/30/2011 0548   HGB 14.2 06/14/2018 1126   HCT 42.7 06/14/2018 1126   PLT 108 (L) 05/30/2011 0548   MCV 92 06/14/2018 1126   MCH 30.7 06/14/2018 1126   MCH 31.0 05/30/2011 0548   MCHC 33.3 06/14/2018 1126   MCHC 34.1 05/30/2011 0548   RDW 12.6 06/14/2018 1126   LYMPHSABS 1.6 06/14/2018 1126   EOSABS 0.3 06/14/2018 1126   BASOSABS 0.0 06/14/2018 1126   Iron/TIBC/Ferritin/ %Sat No results found for: IRON, TIBC, FERRITIN, IRONPCTSAT Lipid Panel     Component Value Date/Time   CHOL 196 06/14/2018 1126   TRIG 94 06/14/2018 1126   HDL 54 06/14/2018 1126   LDLCALC 123 (H) 06/14/2018 1126   Hepatic Function Panel     Component Value Date/Time   PROT 7.1 06/14/2018 1126   ALBUMIN 4.2 06/14/2018 1126   AST 16 06/14/2018 1126   ALT 14 06/14/2018 1126   ALKPHOS 79 06/14/2018 1126   BILITOT 0.4 06/14/2018 1126      Component Value Date/Time   TSH 1.790 06/14/2018 1126      OBESITY BEHAVIORAL INTERVENTION VISIT  Today's visit was # 4   Starting weight: 256 lbs Starting date: 06/14/18 Today's weight : 252 lbs  Today's date: 08/09/2018 Total lbs lost to date: 4    ASK: We discussed the diagnosis of obesity with Katie Holmes today and Katie Holmes agreed to give [place] permission to discuss obesity behavioral modification therapy today.  ASSESS: Azharia has the diagnosis of obesity and her BMI today is 40.69 Katie Holmes is in the action stage of change   ADVISE: Katie Holmes was educated on the multiple health risks of obesity as well as the benefit of weight loss to improve her health. She was advised of the need for long term treatment and the importance of lifestyle modifications to improve her current health and to decrease her risk of future health problems.  AGREE: Multiple dietary modification options and treatment options were discussed and  Katie Holmes agreed to follow the recommendations documented in the above note.  ARRANGE: Katie Holmes was educated on the importance of  frequent visits to treat obesity as outlined per CMS and USPSTF guidelines and agreed to  schedule her next follow up appointment today.  I, Trixie Dredge, am acting as transcriptionist for Dennard Nip, MD  I have reviewed the above documentation for accuracy and completeness, and I agree with the above. -Dennard Nip, MD

## 2018-08-23 ENCOUNTER — Encounter (INDEPENDENT_AMBULATORY_CARE_PROVIDER_SITE_OTHER): Payer: Self-pay | Admitting: Family Medicine

## 2018-08-23 ENCOUNTER — Ambulatory Visit (INDEPENDENT_AMBULATORY_CARE_PROVIDER_SITE_OTHER): Payer: 59 | Admitting: Family Medicine

## 2018-08-23 VITALS — BP 119/82 | HR 68 | Temp 98.0°F | Ht 66.0 in | Wt 249.0 lb

## 2018-08-23 DIAGNOSIS — Z6841 Body Mass Index (BMI) 40.0 and over, adult: Secondary | ICD-10-CM | POA: Diagnosis not present

## 2018-08-23 DIAGNOSIS — Z9189 Other specified personal risk factors, not elsewhere classified: Secondary | ICD-10-CM

## 2018-08-23 DIAGNOSIS — E559 Vitamin D deficiency, unspecified: Secondary | ICD-10-CM

## 2018-08-23 DIAGNOSIS — F3289 Other specified depressive episodes: Secondary | ICD-10-CM

## 2018-08-23 DIAGNOSIS — E66813 Obesity, class 3: Secondary | ICD-10-CM

## 2018-08-23 MED ORDER — VITAMIN D (ERGOCALCIFEROL) 1.25 MG (50000 UNIT) PO CAPS: 50000.0000 [IU] | ORAL_CAPSULE | ORAL | 0 refills | Status: DC

## 2018-08-23 MED ORDER — BUPROPION HCL ER (SR) 150 MG PO TB12: 150.0000 mg | ORAL_TABLET | Freq: Every day | ORAL | 0 refills | Status: DC

## 2018-08-24 NOTE — Progress Notes (Signed)
Office: [phone]  /  Fax: [phone]   HPI:   Chief Complaint: OBESITY Katie Holmes is here to discuss her progress with her obesity treatment plan. She is on the Category 3 plan and is following her eating plan approximately 95 % of the time. She states she is walking 55 minutes 3 to 4 times per week. Katie Holmes continues to do well with weight loss. She notes some family sabotage, but is working on making her food options more interesting.  Her weight is 249 lb (112.9 kg) today and has had a weight loss of 3 pounds over a period of 2 weeks since her last visit. She has lost 7 lbs since starting treatment with [place].  Depression with emotional eating behaviors Katie Holmes's mood is stable on Wellbutrin and she feels more in control of her emotional eating. Her blood pressure is stable. She is struggling with emotional eating and using food for comfort to the extent that it is negatively impacting her health. She often snacks when she is not hungry. Katie Holmes sometimes feels she is out of control and then feels guilty that she made poor food choices. She has been working on behavior modification techniques to help reduce her emotional eating and has been somewhat successful. She denies insomnia.  Vitamin D deficiency Katie Holmes has a diagnosis of vitamin D deficiency. She is currently stable on vit D and denies nausea, vomiting or muscle weakness.  At risk for osteopenia and osteoporosis Katie Holmes is at higher risk of osteopenia and osteoporosis due to vitamin D deficiency.   ASSESSMENT AND PLAN:  Vitamin D deficiency - Plan: Vitamin D, Ergocalciferol, (DRISDOL) 1.25 MG (50000 UT) CAPS capsule  Other depression - with emotional eating  - Plan: buPROPion (WELLBUTRIN SR) 150 MG 12 hr tablet  At risk for osteoporosis  Class 3 severe obesity with serious comorbidity and body mass index (BMI) of 40.0 to 44.9 in adult, unspecified obesity type ([place])  PLAN:  Vitamin D Deficiency Cashae was informed that low vitamin D  levels contributes to fatigue and are associated with obesity, breast, and colon cancer. She agrees to continue to take prescription Vit D @50 ,000 IU every week #4 with no refills and will follow up for routine testing of vitamin D, at least 2-3 times per year. She was informed of the risk of over-replacement of vitamin D and agrees to not increase her dose unless she discusses this with [place] first. Monike agrees to follow up in 2 to 3 weeks.  At risk for osteopenia and osteoporosis Katie Holmes was given extended (15 minutes) osteoporosis prevention counseling today. Katie Holmes is at risk for osteopenia and osteoporosis due to her vitamin D deficiency. She was encouraged to take her vitamin D and follow her higher calcium diet and increase strengthening exercise to help strengthen her bones and decrease her risk of osteopenia and osteoporosis.  Depression with Emotional Eating Behaviors We discussed behavior modification techniques today to help Katie Holmes deal with her emotional eating and depression. She has agreed to take Wellbutrin SR 150mg  qd #90 with no refills and agreed to follow up as directed.  Obesity Katie Holmes is currently in the action stage of change. As such, her goal is to continue with weight loss efforts. She has agreed to follow the Category 3 plan. She was given the Recipes information handout. Katie Holmes has been instructed to work up to a goal of 150 minutes of combined cardio and strengthening exercise per week for weight loss and overall health benefits. We discussed the following  Behavioral Modification Strategies today: increasing lean protein intake, work on meal planning and easy cooking plans, better snacking choices, and dealing with family or coworker sabotage.  Katie Holmes has agreed to follow up with our clinic in 2 to 3 weeks. She was informed of the importance of frequent follow up visits to maximize her success with intensive lifestyle modifications for her multiple health conditions.  ALLERGIES: No  Known Allergies  MEDICATIONS: Current Outpatient Medications on File Prior to Visit  Medication Sig Dispense Refill  . levonorgestrel-ethinyl estradiol (AVIANE,ALESSE,LESSINA) 0.1-20 MG-MCG tablet Take 1 tablet by mouth daily.    . valACYclovir (VALTREX) 500 MG tablet Take 500 mg by mouth daily.     No current facility-administered medications on file prior to visit.     PAST MEDICAL HISTORY: Past Medical History:  Diagnosis Date  . Allergy    seasonal  . Cystitis   . Fatigue   . Fractured toe   . Heart murmur    since childhood- no problems  . History of cryosurgery of cervix complicating pregnancy   . History of herpes simplex type 2 infection   . History of myomectomy   . Intermittent knee pain   . Joint pain   . Obesity complicating pregnancy   . Palpitations     PAST SURGICAL HISTORY: Past Surgical History:  Procedure Laterality Date  . CESAREAN SECTION  05/29/2011   Procedure: CESAREAN SECTION;  Surgeon: Eldred Manges, MD;  Location: [facility];  Service: Gynecology;  Laterality: N/A;  Primary/placenta previa BMI 47  . eye surgery     10 years ago  . MYOMECTOMY  2011  . WISDOM TOOTH EXTRACTION  1989    SOCIAL HISTORY: Social History   Tobacco Use  . Smoking status: Never Smoker  . Smokeless tobacco: Never Used  Substance Use Topics  . Alcohol use: No  . Drug use: No    FAMILY HISTORY: Family History  Problem Relation Age of Onset  . Hypertension Maternal Grandmother   . Diabetes Maternal Grandmother   . Stroke Maternal Grandmother   . Cancer Maternal Grandfather   . Alcohol abuse Maternal Grandfather   . Heart disease Paternal Grandmother   . Hypertension Paternal Grandmother   . Obesity Mother     ROS: Review of Systems  Constitutional: Positive for weight loss.  Gastrointestinal: Negative for nausea and vomiting.  Musculoskeletal:       Negative for muscle weakness.  Psychiatric/Behavioral: Positive for depression. The patient has  insomnia.     PHYSICAL EXAM: Blood pressure 119/82, pulse 68, temperature 98 F (36.7 C), temperature source Oral, height 5\' 6"  (1.676 m), weight 249 lb (112.9 kg), SpO2 98 %. Body mass index is 40.19 kg/m. Physical Exam Vitals signs reviewed.  Constitutional:      Appearance: Normal appearance. She is obese.  Cardiovascular:     Rate and Rhythm: Normal rate.  Pulmonary:     Effort: Pulmonary effort is normal.  Musculoskeletal: Normal range of motion.  Skin:    General: Skin is warm and dry.  Neurological:     Mental Status: She is alert and oriented to person, place, and time.  Psychiatric:        Mood and Affect: Mood normal.        Behavior: Behavior normal.     RECENT LABS AND TESTS: BMET    Component Value Date/Time   NA 141 06/14/2018 1126   K 4.6 06/14/2018 1126   CL 103 06/14/2018 1126  CO2 23 06/14/2018 1126   GLUCOSE 84 06/14/2018 1126   BUN 9 06/14/2018 1126   CREATININE 0.91 06/14/2018 1126   CALCIUM 9.2 06/14/2018 1126   GFRNONAA 74 06/14/2018 1126   GFRAA 86 06/14/2018 1126   Lab Results  Component Value Date   HGBA1C 5.2 06/14/2018   Lab Results  Component Value Date   INSULIN 12.4 06/14/2018   CBC    Component Value Date/Time   WBC 6.6 06/14/2018 1126   WBC 12.3 (H) 05/30/2011 0548   RBC 4.62 06/14/2018 1126   RBC 3.36 (L) 05/30/2011 0548   HGB 14.2 06/14/2018 1126   HCT 42.7 06/14/2018 1126   PLT 108 (L) 05/30/2011 0548   MCV 92 06/14/2018 1126   MCH 30.7 06/14/2018 1126   MCH 31.0 05/30/2011 0548   MCHC 33.3 06/14/2018 1126   MCHC 34.1 05/30/2011 0548   RDW 12.6 06/14/2018 1126   LYMPHSABS 1.6 06/14/2018 1126   EOSABS 0.3 06/14/2018 1126   BASOSABS 0.0 06/14/2018 1126   Iron/TIBC/Ferritin/ %Sat No results found for: IRON, TIBC, FERRITIN, IRONPCTSAT Lipid Panel     Component Value Date/Time   CHOL 196 06/14/2018 1126   TRIG 94 06/14/2018 1126   HDL 54 06/14/2018 1126   LDLCALC 123 (H) 06/14/2018 1126   Hepatic Function  Panel     Component Value Date/Time   PROT 7.1 06/14/2018 1126   ALBUMIN 4.2 06/14/2018 1126   AST 16 06/14/2018 1126   ALT 14 06/14/2018 1126   ALKPHOS 79 06/14/2018 1126   BILITOT 0.4 06/14/2018 1126      Component Value Date/Time   TSH 1.790 06/14/2018 1126   Results for ELLISHA, BANKSON (MRN [id number]) as of 08/24/2018 09:16  Ref. Range 06/14/2018 11:26  Vitamin D, 25-Hydroxy Latest Ref Range: 30.0 - 100.0 ng/mL 31.3    OBESITY BEHAVIORAL INTERVENTION VISIT  Today's visit was # 5   Starting weight: 256 lbs Starting date: 06/14/18 Today's weight : Weight: 249 lb (112.9 kg)  Today's date: 08/23/2018 Total lbs lost to date: 7  ASK: We discussed the diagnosis of obesity with Reinaldo Berber today and Clarise Cruz agreed to give [place] permission to discuss obesity behavioral modification therapy today.  ASSESS: Analiyah has the diagnosis of obesity and her BMI today is 40.2. Ardath is in the action stage of change.   ADVISE: Kaliyah was educated on the multiple health risks of obesity as well as the benefit of weight loss to improve her health. She was advised of the need for long term treatment and the importance of lifestyle modifications to improve her current health and to decrease her risk of future health problems.  AGREE: Multiple dietary modification options and treatment options were discussed and Matteson agreed to follow the recommendations documented in the above note.  ARRANGE: Hadasah was educated on the importance of frequent visits to treat obesity as outlined per CMS and USPSTF guidelines and agreed to schedule her next follow up appointment today.  I, Marcille Blanco, am acting as transcriptionist for Starlyn Skeans, MD  I have reviewed the above documentation for accuracy and completeness, and I agree with the above. -Dennard Nip, MD

## 2018-09-15 ENCOUNTER — Ambulatory Visit (INDEPENDENT_AMBULATORY_CARE_PROVIDER_SITE_OTHER): Payer: 59 | Admitting: Family Medicine

## 2018-09-17 ENCOUNTER — Other Ambulatory Visit (INDEPENDENT_AMBULATORY_CARE_PROVIDER_SITE_OTHER): Payer: Self-pay | Admitting: Family Medicine

## 2018-09-17 DIAGNOSIS — E559 Vitamin D deficiency, unspecified: Secondary | ICD-10-CM

## 2018-09-21 ENCOUNTER — Encounter (INDEPENDENT_AMBULATORY_CARE_PROVIDER_SITE_OTHER): Payer: Self-pay

## 2018-09-21 ENCOUNTER — Ambulatory Visit (INDEPENDENT_AMBULATORY_CARE_PROVIDER_SITE_OTHER): Payer: 59 | Admitting: Family Medicine

## 2018-09-22 ENCOUNTER — Encounter (INDEPENDENT_AMBULATORY_CARE_PROVIDER_SITE_OTHER): Payer: Self-pay | Admitting: Family Medicine

## 2018-09-22 ENCOUNTER — Ambulatory Visit (INDEPENDENT_AMBULATORY_CARE_PROVIDER_SITE_OTHER): Payer: 59 | Admitting: Family Medicine

## 2018-09-22 VITALS — BP 103/70 | HR 113 | Ht 66.0 in | Wt 245.0 lb

## 2018-09-22 DIAGNOSIS — Z6839 Body mass index (BMI) 39.0-39.9, adult: Secondary | ICD-10-CM

## 2018-09-22 DIAGNOSIS — E559 Vitamin D deficiency, unspecified: Secondary | ICD-10-CM

## 2018-09-22 DIAGNOSIS — E8881 Metabolic syndrome: Secondary | ICD-10-CM

## 2018-09-22 DIAGNOSIS — Z9189 Other specified personal risk factors, not elsewhere classified: Secondary | ICD-10-CM | POA: Diagnosis not present

## 2018-09-22 MED ORDER — VITAMIN D (ERGOCALCIFEROL) 1.25 MG (50000 UNIT) PO CAPS: 50000.0000 [IU] | ORAL_CAPSULE | ORAL | 0 refills | Status: DC

## 2018-09-22 NOTE — Progress Notes (Signed)
Office: [phone]  /  Fax: [phone]   HPI:   Chief Complaint: OBESITY Katie Holmes is here to discuss her progress with her obesity treatment plan. She is on the Category 3 plan and is following her eating plan approximately 90 to 95 % of the time. She states she is walking 50 to 55 minutes 2 to 3 times per week. Katie Holmes continues to do well with Category 3 plan. Hunger is controlled and she is doing well working on meal planning. She has some sabotage from her daughter at times.  Her weight is 245 lb (111.1 kg) today and has had a weight loss of 4 pounds over a period of 4 weeks since her last visit. She has lost 11 lbs since starting treatment with [place].  Vitamin D deficiency Katie Holmes has a diagnosis of vitamin D deficiency. She is currently taking vit D, but is not yet at goal. She denies nausea, vomiting, or muscle weakness.  Insulin Resistance Katie Holmes has a diagnosis of insulin resistance based on her elevated fasting insulin level >5. Although Katie Holmes's blood glucose readings are still under good control, insulin resistance puts her at greater risk of metabolic syndrome and diabetes. She is not taking metformin currently and is working on diet and exercise to decrease risk of diabetes. Polyphagia has improved with decreasing simple carbs.  At risk for diabetes Katie Holmes is at higher than average risk for developing diabetes due to her insulin resistance and obesity. She currently denies polyuria or polydipsia.  ASSESSMENT AND PLAN:  Vitamin D deficiency - Plan: Vitamin D, Ergocalciferol, (DRISDOL) 1.25 MG (50000 UT) CAPS capsule  Insulin resistance  At risk for diabetes mellitus  Class 2 severe obesity with serious comorbidity and body mass index (BMI) of 39.0 to 39.9 in adult, unspecified obesity type ([place])  PLAN:  Vitamin D Deficiency Katie Holmes was informed that low vitamin D levels contributes to fatigue and are associated with obesity, breast, and colon cancer. Katie Holmes agrees to continue to take  prescription Vit D @50 ,000 IU every week #4 with no refills and will follow up for routine testing of vitamin D, at least 2-3 times per year. She was informed of the risk of over-replacement of vitamin D and agrees to not increase her dose unless she discusses this with [place] first. We will repeat labs at her next visit and Katie Holmes agrees to follow up in 3 weeks as directed.  Insulin Resistance Katie Holmes will continue to work on weight loss, exercise, and decreasing simple carbohydrates in her diet to help decrease the risk of diabetes. She was informed that eating too many simple carbohydrates or too many calories at one sitting increases the likelihood of GI side effects. Katie Holmes agreed continue with her diet and exercise. We will recheck labs at her next visit. Katie Holmes agreed to follow up with [place] as directed to monitor her progress.  Diabetes risk counseling Katie Holmes was given extended (15 minutes) diabetes prevention counseling today. She is 48 y.o. female and has risk factors for diabetes including insulin resistance and obesity. We discussed intensive lifestyle modifications today with an emphasis on weight loss as well as increasing exercise and decreasing simple carbohydrates in her diet.  Obesity Katie Holmes is currently in the action stage of change. As such, her goal is to continue with weight loss efforts. She has agreed to follow the Category 3 plan. Katie Holmes has been instructed to work up to a goal of 150 minutes of combined cardio and strengthening exercise per week for weight loss and  overall health benefits. We discussed the following Behavioral Modification Strategies today: increasing lean protein intake, decreasing simple carbohydrates, work on meal planning and easy cooking plans, and dealing with family or coworker sabotage.  Katie Holmes has agreed to follow up with our clinic in 3 weeks for a fasting appointment. She was informed of the importance of frequent follow up visits to maximize her success with intensive  lifestyle modifications for her multiple health conditions.  ALLERGIES: No Known Allergies  MEDICATIONS: Current Outpatient Medications on File Prior to Visit  Medication Sig Dispense Refill  . buPROPion (WELLBUTRIN SR) 150 MG 12 hr tablet Take 1 tablet (150 mg total) by mouth daily. 90 tablet 0  . levonorgestrel-ethinyl estradiol (AVIANE,ALESSE,LESSINA) 0.1-20 MG-MCG tablet Take 1 tablet by mouth daily.    . valACYclovir (VALTREX) 500 MG tablet Take 500 mg by mouth daily.     No current facility-administered medications on file prior to visit.     PAST MEDICAL HISTORY: Past Medical History:  Diagnosis Date  . Allergy    seasonal  . Cystitis   . Fatigue   . Fractured toe   . Heart murmur    since childhood- no problems  . History of cryosurgery of cervix complicating pregnancy   . History of herpes simplex type 2 infection   . History of myomectomy   . Intermittent knee pain   . Joint pain   . Obesity complicating pregnancy   . Palpitations     PAST SURGICAL HISTORY: Past Surgical History:  Procedure Laterality Date  . CESAREAN SECTION  05/29/2011   Procedure: CESAREAN SECTION;  Surgeon: Eldred Manges, MD;  Location: [facility];  Service: Gynecology;  Laterality: N/A;  Primary/placenta previa BMI 47  . eye surgery     10 years ago  . MYOMECTOMY  2011  . WISDOM TOOTH EXTRACTION  1989    SOCIAL HISTORY: Social History   Tobacco Use  . Smoking status: Never Smoker  . Smokeless tobacco: Never Used  Substance Use Topics  . Alcohol use: No  . Drug use: No    FAMILY HISTORY: Family History  Problem Relation Age of Onset  . Hypertension Maternal Grandmother   . Diabetes Maternal Grandmother   . Stroke Maternal Grandmother   . Cancer Maternal Grandfather   . Alcohol abuse Maternal Grandfather   . Heart disease Paternal Grandmother   . Hypertension Paternal Grandmother   . Obesity Mother     ROS: Review of Systems  Constitutional: Positive for weight loss.    Gastrointestinal: Negative for nausea and vomiting.  Genitourinary:       Negative for polyuria.  Musculoskeletal:       Negative for muscle weakness.  Endo/Heme/Allergies: Negative for polydipsia.       Positive for polyphagia.    PHYSICAL EXAM: Blood pressure 103/70, pulse (!) 113, height 5\' 6"  (1.676 m), weight 245 lb (111.1 kg), SpO2 96 %. Body mass index is 39.54 kg/m. Physical Exam Vitals signs reviewed.  Constitutional:      Appearance: Normal appearance. She is obese.  Cardiovascular:     Rate and Rhythm: Normal rate.  Pulmonary:     Effort: Pulmonary effort is normal.  Musculoskeletal: Normal range of motion.  Skin:    General: Skin is warm and dry.  Neurological:     Mental Status: She is alert and oriented to person, place, and time.  Psychiatric:        Mood and Affect: Mood normal.  Behavior: Behavior normal.     RECENT LABS AND TESTS: BMET    Component Value Date/Time   NA 141 06/14/2018 1126   K 4.6 06/14/2018 1126   CL 103 06/14/2018 1126   CO2 23 06/14/2018 1126   GLUCOSE 84 06/14/2018 1126   BUN 9 06/14/2018 1126   CREATININE 0.91 06/14/2018 1126   CALCIUM 9.2 06/14/2018 1126   GFRNONAA 74 06/14/2018 1126   GFRAA 86 06/14/2018 1126   Lab Results  Component Value Date   HGBA1C 5.2 06/14/2018   Lab Results  Component Value Date   INSULIN 12.4 06/14/2018   CBC    Component Value Date/Time   WBC 6.6 06/14/2018 1126   WBC 12.3 (H) 05/30/2011 0548   RBC 4.62 06/14/2018 1126   RBC 3.36 (L) 05/30/2011 0548   HGB 14.2 06/14/2018 1126   HCT 42.7 06/14/2018 1126   PLT 108 (L) 05/30/2011 0548   MCV 92 06/14/2018 1126   MCH 30.7 06/14/2018 1126   MCH 31.0 05/30/2011 0548   MCHC 33.3 06/14/2018 1126   MCHC 34.1 05/30/2011 0548   RDW 12.6 06/14/2018 1126   LYMPHSABS 1.6 06/14/2018 1126   EOSABS 0.3 06/14/2018 1126   BASOSABS 0.0 06/14/2018 1126   Iron/TIBC/Ferritin/ %Sat No results found for: IRON, TIBC, FERRITIN,  IRONPCTSAT Lipid Panel     Component Value Date/Time   CHOL 196 06/14/2018 1126   TRIG 94 06/14/2018 1126   HDL 54 06/14/2018 1126   LDLCALC 123 (H) 06/14/2018 1126   Hepatic Function Panel     Component Value Date/Time   PROT 7.1 06/14/2018 1126   ALBUMIN 4.2 06/14/2018 1126   AST 16 06/14/2018 1126   ALT 14 06/14/2018 1126   ALKPHOS 79 06/14/2018 1126   BILITOT 0.4 06/14/2018 1126      Component Value Date/Time   TSH 1.790 06/14/2018 1126   Results for JILIAN, WEST (MRN [id number]) as of 09/22/2018 12:45  Ref. Range 06/14/2018 11:26  Vitamin D, 25-Hydroxy Latest Ref Range: 30.0 - 100.0 ng/mL 31.3    OBESITY BEHAVIORAL INTERVENTION VISIT  Today's visit was # 6   Starting weight: 256 lbs Starting date: 06/14/18 Today's weight : Weight: 245 lb (111.1 kg)  Today's date: 09/22/2018 Total lbs lost to date: 11    09/22/2018  Height 5\' 6"  (1.676 m)  Weight 245 lb (111.1 kg)  BMI (Calculated) 39.56  BLOOD PRESSURE - SYSTOLIC 841  BLOOD PRESSURE - DIASTOLIC 70   Body Fat % 32.4 %  Total Body Water (lbs) 92.2 lbs    ASK: We discussed the diagnosis of obesity with Reinaldo Berber today and Meyah agreed to give [place] permission to discuss obesity behavioral modification therapy today.  ASSESS: Chapel has the diagnosis of obesity and her BMI today is 39.56. [place] is in the action stage of change.   ADVISE: Nubia was educated on the multiple health risks of obesity as well as the benefit of weight loss to improve her health. She was advised of the need for long term treatment and the importance of lifestyle modifications to improve her current health and to decrease her risk of future health problems.  AGREE: Multiple dietary modification options and treatment options were discussed and Jhoanna agreed to follow the recommendations documented in the above note.  ARRANGE: Khadeja was educated on the importance of frequent visits to treat obesity as outlined per CMS and USPSTF guidelines and  agreed to schedule her next follow up appointment today.  I,  Marcille Blanco, CMA, am acting as transcriptionist for Starlyn Skeans, MD  I have reviewed the above documentation for accuracy and completeness, and I agree with the above. -Dennard Nip, MD

## 2018-09-23 ENCOUNTER — Ambulatory Visit: Payer: Self-pay | Admitting: Nurse Practitioner

## 2018-09-23 VITALS — BP 120/75 | HR 100 | Temp 99.1°F | Resp 16 | Wt 250.0 lb

## 2018-09-23 DIAGNOSIS — J020 Streptococcal pharyngitis: Secondary | ICD-10-CM

## 2018-09-23 MED ORDER — AMOXICILLIN 875 MG PO TABS: 875.0000 mg | ORAL_TABLET | Freq: Two times a day (BID) | ORAL | 0 refills | Status: AC

## 2018-09-23 NOTE — Patient Instructions (Signed)
Strep Throat -Take medication as prescribed. -Ibuprofen or Tylenol for pain, fever, or general discomfort. -Increase fluids. -Get plenty of rest. -May continue use of Chloraseptic throat spray to help with throat pain or discomfort. -Warm salt water gargles 3-4 times daily until symptoms improve. -Change her toothbrush after 48 hours. -May use a teaspoon of honey as needed to help with throat pain or discomfort. -You will need to be on antibiotics for at least 24 hours to help prevent spread. -Follow-up with your PCP if symptoms do not improve.    Strep throat is a bacterial infection of the throat. Your health care provider may call the infection tonsillitis or pharyngitis, depending on whether there is swelling in the tonsils or at the back of the throat. Strep throat is most common during the cold months of the year in children who are 39-52 years of age, but it can happen during any season in people of any age. This infection is spread from person to person (contagious) through coughing, sneezing, or close contact. What are the causes? Strep throat is caused by the bacteria called Streptococcus pyogenes. What increases the risk? This condition is more likely to develop in:  People who spend time in crowded places where the infection can spread easily.  People who have close contact with someone who has strep throat. What are the signs or symptoms? Symptoms of this condition include:  Fever or chills.  Redness, swelling, or pain in the tonsils or throat.  Pain or difficulty when swallowing.  White or yellow spots on the tonsils or throat.  Swollen, tender glands in the neck or under the jaw.  Red rash all over the body (rare). How is this diagnosed? This condition is diagnosed by performing a rapid strep test or by taking a swab of your throat (throat culture test). Results from a rapid strep test are usually ready in a few minutes, but throat culture test results are available  after one or two days. How is this treated? This condition is treated with antibiotic medicine. Follow these instructions at home: Medicines  Take over-the-counter and prescription medicines only as told by your health care provider.  Take your antibiotic as told by your health care provider. Do not stop taking the antibiotic even if you start to feel better.  Have family members who also have a sore throat or fever tested for strep throat. They may need antibiotics if they have the strep infection. Eating and drinking  Do not share food, drinking cups, or personal items that could cause the infection to spread to other people.  If swallowing is difficult, try eating soft foods until your sore throat feels better.  Drink enough fluid to keep your urine clear or pale yellow. General instructions  Gargle with a salt-water mixture 3-4 times per day or as needed. To make a salt-water mixture, completely dissolve -1 tsp of salt in 1 cup of warm water.  Make sure that all household members wash their hands well.  Get plenty of rest.  Stay home from school or work until you have been taking antibiotics for 24 hours.  Keep all follow-up visits as told by your health care provider. This is important. Contact a health care provider if:  The glands in your neck continue to get bigger.  You develop a rash, cough, or earache.  You cough up a thick liquid that is green, yellow-brown, or bloody.  You have pain or discomfort that does not get better with medicine.  Your problems seem to be getting worse rather than better.  You have a fever. Get help right away if:  You have new symptoms, such as vomiting, severe headache, stiff or painful neck, chest pain, or shortness of breath.  You have severe throat pain, drooling, or changes in your voice.  You have swelling of the neck, or the skin on the neck becomes red and tender.  You have signs of dehydration, such as fatigue, dry  mouth, and decreased urination.  You become increasingly sleepy, or you cannot wake up completely.  Your joints become red or painful. This information is not intended to replace advice given to you by your health care provider. Make sure you discuss any questions you have with your health care provider. Document Released: 07/10/2000 Document Revised: 03/11/2016 Document Reviewed: 11/05/2014 Elsevier Interactive Patient Education  [employer].

## 2018-09-23 NOTE — Progress Notes (Signed)
MRN: [id number] DOB: 1970/10/04  Subjective:   Katie Holmes is a 48 y.o. female presenting for chief complaint of Sore Throat (X 4 DAYS, SWOLLEN GLANDS ); Chills (X 2 DAYS); and Fever (X 2 DAYS ) .  Reports 4 day history of fever, sore throat, difficulty swallowing and pain with swallowing, fever (101.4) and chills, which developed over the past 48 hours. Has tried chloraseptic with some relief. Denies sinus headache, sinus congestion , sinus pain, rhinorrhea, ear fullness, ear drainage, voice change, dry cough, productive cough, wheezing and shortness of breath, malaise, nausea, vomiting, abdominal pain and diarrhea. Has not had any sick contact with strep. Admits to a history of seasonal allergies, denies history of asthma. Patient has had flu shot this season. Denies smoking.  Denies any other aggravating or relieving factors, no other questions or concerns.  Patient was seen at another urgent care on Wednesday, 2/22 and was given a prescription for Flonase.  Patient also had a rapid stress test that was performed which was negative.  Patient states over the past 2 days she has now developed fever and chills,  worsening sore throat pain and fatigue.  Review of Systems  Constitutional: Positive for chills, fever and malaise/fatigue.  HENT: Positive for sore throat. Negative for congestion, ear discharge, ear pain and sinus pain.   Eyes: Negative.   Respiratory: Negative.   Cardiovascular: Negative.   Skin: Negative.   Neurological: Negative.     Katie Holmes has a current medication list which includes the following prescription(s): bupropion, ibuprofen, levonorgestrel-ethinyl estradiol, valacyclovir, and vitamin d (ergocalciferol). Also has No Known Allergies.  Katie Holmes  has a past medical history of Allergy, Cystitis, Fatigue, Fractured toe, Heart murmur, History of cryosurgery of cervix complicating pregnancy, History of herpes simplex type 2 infection, History of myomectomy, Intermittent knee pain, Joint  pain, Obesity complicating pregnancy, and Palpitations. Also  has a past surgical history that includes Myomectomy (2011); Cesarean section (05/29/2011); Wisdom tooth extraction (1989); and eye surgery.   Objective:   Vitals: BP 120/75 (BP Location: Right Arm, Patient Position: Sitting, Cuff Size: Normal)   Pulse 100   Temp 99.1 F (37.3 C) (Oral)   Resp 16   Wt 250 lb (113.4 kg)   SpO2 99%   BMI 40.35 kg/m   Physical Exam Vitals signs reviewed.  Constitutional:      General: She is not in acute distress. HENT:     Head: Normocephalic.     Right Ear: Tympanic membrane and ear canal normal.     Left Ear: Tympanic membrane and ear canal normal.     Nose: Mucosal edema present. No congestion or rhinorrhea.     Right Turbinates: Enlarged and swollen.     Left Turbinates: Enlarged and swollen.     Right Sinus: No maxillary sinus tenderness or frontal sinus tenderness.     Left Sinus: No maxillary sinus tenderness or frontal sinus tenderness.     Mouth/Throat:     Lips: Pink.     Mouth: Mucous membranes are moist.     Pharynx: Uvula midline. Pharyngeal swelling, oropharyngeal exudate, posterior oropharyngeal erythema and uvula swelling present.     Tonsils: Tonsillar exudate present. No tonsillar abscesses. Swelling: 2+ on the right. 2+ on the left.     Comments: Malodorous breath Eyes:     Conjunctiva/sclera: Conjunctivae normal.  Neck:     Musculoskeletal: Normal range of motion and neck supple.  Cardiovascular:     Rate and Rhythm: Normal rate and regular  rhythm.     Heart sounds: Normal heart sounds.  Pulmonary:     Effort: Pulmonary effort is normal. No respiratory distress.     Breath sounds: Normal breath sounds. No stridor. No wheezing, rhonchi or rales.  Abdominal:     General: Bowel sounds are normal.     Palpations: Abdomen is soft.     Tenderness: There is no abdominal tenderness.  Lymphadenopathy:     Cervical: Cervical adenopathy (bilateral superficial)  present.  Skin:    General: Skin is warm and dry.     Capillary Refill: Capillary refill takes less than 2 seconds.  Neurological:     General: No focal deficit present.     Mental Status: She is alert and oriented to person, place, and time.     Centor Score Tonsillar Exudate: 1 Fever/Hx of fever >38 Degrees Celsius: 1 Tender anterior cervical lymphadenopathy: 1 Age 82-14: +1 Age 12- 27: 0 Age > 36: -1 Total: 3  Assessment and Plan :   Exam findings, diagnosis etiology and medication use and indications reviewed with patient. Follow- Up and discharge instructions provided. No emergent/urgent issues found on exam.  A clinical diagnosis was made for this patient based on the presence of tonsillar exudate, fever, swollen glands, malodorous breath, and cervical adenopathy.  Patient Centor score was 3.  The patient was instructed to continue symptomatic treatment to include ibuprofen or Tylenol for pain, Chloraseptic mouth spray, and warm salt water gargles.  Patient was also instructed to change her toothbrush after 48 hours.  Discussed with patient that strep was self-limiting; however, cases are treated to prevent peritonsillar abscess and rheumatic fever.  Patient education was provided. Patient verbalized understanding of information provided and agrees with plan of care (POC), all questions answered. The patient is advised to call or return to clinic if condition does not see an improvement in symptoms, or to seek the care of the closest emergency department if condition worsens with the above plan.    1. Strep throat  - amoxicillin (AMOXIL) 875 MG tablet; Take 1 tablet (875 mg total) by mouth 2 (two) times daily for 7 days.  Dispense: 14 tablet; Refill: 0 -Take medication as prescribed. -Ibuprofen or Tylenol for pain, fever, or general discomfort. -Increase fluids. -Get plenty of rest. -May continue use of Chloraseptic throat spray to help with throat pain or discomfort. -Warm salt  water gargles 3-4 times daily until symptoms improve. -Change her toothbrush after 48 hours. -May use a teaspoon of honey as needed to help with throat pain or discomfort. -You will need to be on antibiotics for at least 24 hours to help prevent spread. -Follow-up with your PCP if symptoms do not improve.

## 2018-09-26 ENCOUNTER — Telehealth: Payer: Self-pay

## 2018-09-26 NOTE — Telephone Encounter (Signed)
Patient states she feels like a new women, she is feeling great.

## 2018-10-17 ENCOUNTER — Ambulatory Visit (INDEPENDENT_AMBULATORY_CARE_PROVIDER_SITE_OTHER): Payer: 59 | Admitting: Physician Assistant

## 2018-10-17 ENCOUNTER — Encounter (INDEPENDENT_AMBULATORY_CARE_PROVIDER_SITE_OTHER): Payer: Self-pay

## 2018-10-20 ENCOUNTER — Encounter (INDEPENDENT_AMBULATORY_CARE_PROVIDER_SITE_OTHER): Payer: Self-pay

## 2018-11-10 ENCOUNTER — Ambulatory Visit (INDEPENDENT_AMBULATORY_CARE_PROVIDER_SITE_OTHER): Payer: 59 | Admitting: Family Medicine

## 2018-11-10 ENCOUNTER — Other Ambulatory Visit: Payer: Self-pay

## 2018-11-10 DIAGNOSIS — E559 Vitamin D deficiency, unspecified: Secondary | ICD-10-CM

## 2018-11-10 DIAGNOSIS — Z6839 Body mass index (BMI) 39.0-39.9, adult: Secondary | ICD-10-CM

## 2018-11-10 DIAGNOSIS — E7849 Other hyperlipidemia: Secondary | ICD-10-CM

## 2018-11-10 DIAGNOSIS — F3289 Other specified depressive episodes: Secondary | ICD-10-CM | POA: Diagnosis not present

## 2018-11-10 DIAGNOSIS — E8881 Metabolic syndrome: Secondary | ICD-10-CM

## 2018-11-10 MED ORDER — VITAMIN D (ERGOCALCIFEROL) 1.25 MG (50000 UNIT) PO CAPS: 50000.0000 [IU] | ORAL_CAPSULE | ORAL | 0 refills | Status: DC

## 2018-11-10 MED ORDER — BUPROPION HCL ER (SR) 150 MG PO TB12: 150.0000 mg | ORAL_TABLET | Freq: Every day | ORAL | 0 refills | Status: AC

## 2018-11-16 LAB — VITAMIN D 25 HYDROXY (VIT D DEFICIENCY, FRACTURES): Vit D, 25-Hydroxy: 53.8 ng/mL (ref 30.0–100.0)

## 2018-11-16 LAB — LIPID PANEL WITH LDL/HDL RATIO
Cholesterol, Total: 174 mg/dL (ref 100–199)
HDL: 61 mg/dL (ref >39)
LDL Calculated: 101 mg/dL — ABNORMAL HIGH (ref 0–99)
LDl/HDL Ratio: 1.7 ratio (ref 0.0–3.2)
Triglycerides: 59 mg/dL (ref 0–149)
VLDL Cholesterol Cal: 12 mg/dL (ref 5–40)

## 2018-11-16 LAB — INSULIN, RANDOM: INSULIN: 7.8 u[IU]/mL (ref 2.6–24.9)

## 2018-11-16 LAB — HEMOGLOBIN A1C
Est. average glucose Bld gHb Est-mCnc: 97 mg/dL
Hgb A1c MFr Bld: 5 % (ref 4.8–5.6)

## 2018-11-16 NOTE — Progress Notes (Signed)
Office: [phone]  /  Fax: [phone] TeleHealth Visit:  Katie Holmes has verbally consented to this TeleHealth visit today. The patient is located at home, the provider is located at the [employer] [facility] office. The participants in this visit include the listed provider and patient. The visit was conducted today via face time.  HPI:   Chief Complaint: OBESITY Katie Holmes is here to discuss her progress with her obesity treatment plan. She is on the Category 3 plan and is following her eating plan approximately 80 % of the time. She states she is exercising 0 minutes 0 times per week. Talene missed her last appointment secondary to corona virus concerns. She is using [employer] for grocery delivery. She has found that finding protein to be more difficult, but has made appropriate substitutions.  We were unable to weigh the patient today for this TeleHealth visit. She feels as if she has maintained her weight since her last visit. She has lost 11 lbs since starting treatment with [place].  Vitamin D Deficiency Katie Holmes has a diagnosis of vitamin D deficiency. She  currently taking prescription Vit D. She notes fatigue and denies nausea, vomiting or muscle weakness.  Hyperlipidemia Katie Holmes has hyperlipidemia and has been trying to improve her cholesterol levels with intensive lifestyle modification including a low saturated fat diet, exercise and weight loss. Last LDL was of 123 and HDL of 54. She denies any chest pain, claudication or myalgias.  Insulin Resistance Katie Holmes has a diagnosis of insulin resistance based on her elevated fasting insulin level >5. Although Katie Holmes's blood glucose readings are still under good control, insulin resistance puts her at greater risk of metabolic syndrome and diabetes. She is not taking metformin currently and continues to work on diet and exercise to decrease risk of diabetes.  Depression with Emotional Eating Behaviors Katie Holmes denies side effects of insomnia and notes  her symptoms are better controlled with medicine. Katie Holmes struggles with emotional eating and using food for comfort to the extent that it is negatively impacting her health. She often snacks when she is not hungry. Katie Holmes sometimes feels she is out of control and then feels guilty that she made poor food choices. She has been working on behavior modification techniques to help reduce her emotional eating and has been somewhat successful. She shows no sign of suicidal or homicidal ideations.  Depression screen PHQ 2/9 06/14/2018  Decreased Interest 1  Down, Depressed, Hopeless 0  PHQ - 2 Score 1  Altered sleeping 0  Tired, decreased energy 3  Change in appetite 1  Feeling bad or failure about yourself  1  Trouble concentrating 0  Moving slowly or fidgety/restless 1  Suicidal thoughts 0  PHQ-9 Score 7  Difficult doing work/chores Not difficult at all    ASSESSMENT AND PLAN:  Insulin resistance - Plan: Lipid Panel With LDL/HDL Ratio, Hemoglobin A1c, Insulin, random  Vitamin D deficiency - Plan: VITAMIN D 25 Hydroxy (Vit-D Deficiency, Fractures), Vitamin D, Ergocalciferol, (DRISDOL) 1.25 MG (50000 UT) CAPS capsule  Other depression - with emotional eating  - Plan: buPROPion (WELLBUTRIN SR) 150 MG 12 hr tablet  Class 2 severe obesity with serious comorbidity and body mass index (BMI) of 39.0 to 39.9 in adult, unspecified obesity type (HCC)  PLAN:  Vitamin D Deficiency Katie Holmes was informed that low vitamin D levels contributes to fatigue and are associated with obesity, breast, and colon cancer. Katie Holmes agrees to continue taking prescription Vit D @50 ,000 IU every week #12, 90 day supply  with no refills. She will follow up for routine testing of vitamin D, at least 2-3 times per year. She was informed of the risk of over-replacement of vitamin D and agrees to not increase her dose unless she discusses this with [place] first. Katie Holmes agrees to follow up with our clinic in 3 weeks with Dr. Leafy Ro.   Hyperlipidemia Katie Holmes was informed of the American Heart Association Guidelines emphasizing intensive lifestyle modifications as the first line treatment for hyperlipidemia. We discussed many lifestyle modifications today in depth, and Katie Holmes will continue to work on decreasing saturated fats such as fatty red meat, butter and many fried foods. She will also increase vegetables and lean protein in her diet and continue to work on exercise and weight loss efforts. We will repeat FLP. Katie Holmes agrees to follow up with our clinic in 3 weeks with Dr. Leafy Ro.  Insulin Resistance Katie Holmes will continue to work on weight loss, exercise, and decreasing simple carbohydrates in her diet to help decrease the risk of diabetes. We dicussed metformin including benefits and risks. She was informed that eating too many simple carbohydrates or too many calories at one sitting increases the likelihood of GI side effects. Katie Holmes declined metformin for now and prescription was not written today. We will repeat Hgb A1c and insulin. Katie Holmes agrees to follow up with our clinic in 3 weeks with Dr. Leafy Ro as directed to monitor her progress.  Depression with Emotional Eating Behaviors We discussed behavior modification techniques today to help Katie Holmes deal with her emotional eating and depression. Katie Holmes agrees to continue taking Wellbutrin SR 150 mg qd #90 day supply with no refills. Katie Holmes agrees to follow up with our clinic in 3 weeks with Dr. Leafy Ro.  Obesity Katie Holmes is currently in the action stage of change. As such, her goal is to continue with weight loss efforts She has agreed to follow the Category 3 plan Katie Holmes has been instructed to work up to a goal of 150 minutes of combined cardio and strengthening exercise per week for weight loss and overall health benefits. We discussed the following Behavioral Modification Strategies today: increasing lean protein intake, work on meal planning and easy cooking plans, emotional eating strategies,  ways to avoid boredom eating, better snacking choices, and planning for success   Katie Holmes has agreed to follow up with our clinic in 3 weeks. She was informed of the importance of frequent follow up visits to maximize her success with intensive lifestyle modifications for her multiple health conditions.  ALLERGIES: No Known Allergies  MEDICATIONS: Current Outpatient Medications on File Prior to Visit  Medication Sig Dispense Refill  . ibuprofen (ADVIL,MOTRIN) 200 MG tablet Take 200 mg by mouth every 6 (six) hours as needed.    Marland Kitchen levonorgestrel-ethinyl estradiol (AVIANE,ALESSE,LESSINA) 0.1-20 MG-MCG tablet Take 1 tablet by mouth daily.    . valACYclovir (VALTREX) 500 MG tablet Take 500 mg by mouth daily.     No current facility-administered medications on file prior to visit.     PAST MEDICAL HISTORY: Past Medical History:  Diagnosis Date  . Allergy    seasonal  . Cystitis   . Fatigue   . Fractured toe   . Heart murmur    since childhood- no problems  . History of cryosurgery of cervix complicating pregnancy   . History of herpes simplex type 2 infection   . History of myomectomy   . Intermittent knee pain   . Joint pain   . Obesity complicating pregnancy   . Palpitations  PAST SURGICAL HISTORY: Past Surgical History:  Procedure Laterality Date  . CESAREAN SECTION  05/29/2011   Procedure: CESAREAN SECTION;  Surgeon: Eldred Manges, MD;  Location: [facility];  Service: Gynecology;  Laterality: N/A;  Primary/placenta previa BMI 47  . eye surgery     10 years ago  . MYOMECTOMY  2011  . WISDOM TOOTH EXTRACTION  1989    SOCIAL HISTORY: Social History   Tobacco Use  . Smoking status: Never Smoker  . Smokeless tobacco: Never Used  Substance Use Topics  . Alcohol use: No  . Drug use: No    FAMILY HISTORY: Family History  Problem Relation Age of Onset  . Hypertension Maternal Grandmother   . Diabetes Maternal Grandmother   . Stroke Maternal Grandmother   . Cancer  Maternal Grandfather   . Alcohol abuse Maternal Grandfather   . Heart disease Paternal Grandmother   . Hypertension Paternal Grandmother   . Obesity Mother     ROS: Review of Systems  Constitutional: Positive for malaise/fatigue. Negative for weight loss.  Cardiovascular: Negative for chest pain and claudication.  Gastrointestinal: Negative for nausea and vomiting.  Musculoskeletal: Negative for myalgias.       Negative muscle weakness  Psychiatric/Behavioral: Positive for depression. Negative for suicidal ideas.    PHYSICAL EXAM: Pt in no acute distress  RECENT LABS AND TESTS: BMET    Component Value Date/Time   NA 141 06/14/2018 1126   K 4.6 06/14/2018 1126   CL 103 06/14/2018 1126   CO2 23 06/14/2018 1126   GLUCOSE 84 06/14/2018 1126   BUN 9 06/14/2018 1126   CREATININE 0.91 06/14/2018 1126   CALCIUM 9.2 06/14/2018 1126   GFRNONAA 74 06/14/2018 1126   GFRAA 86 06/14/2018 1126   Lab Results  Component Value Date   HGBA1C 5.0 11/15/2018   HGBA1C 5.2 06/14/2018   Lab Results  Component Value Date   INSULIN 7.8 11/15/2018   INSULIN 12.4 06/14/2018   CBC    Component Value Date/Time   WBC 6.6 06/14/2018 1126   WBC 12.3 (H) 05/30/2011 0548   RBC 4.62 06/14/2018 1126   RBC 3.36 (L) 05/30/2011 0548   HGB 14.2 06/14/2018 1126   HCT 42.7 06/14/2018 1126   PLT 108 (L) 05/30/2011 0548   MCV 92 06/14/2018 1126   MCH 30.7 06/14/2018 1126   MCH 31.0 05/30/2011 0548   MCHC 33.3 06/14/2018 1126   MCHC 34.1 05/30/2011 0548   RDW 12.6 06/14/2018 1126   LYMPHSABS 1.6 06/14/2018 1126   EOSABS 0.3 06/14/2018 1126   BASOSABS 0.0 06/14/2018 1126   Iron/TIBC/Ferritin/ %Sat No results found for: IRON, TIBC, FERRITIN, IRONPCTSAT Lipid Panel     Component Value Date/Time   CHOL 174 11/15/2018 0929   TRIG 59 11/15/2018 0929   HDL 61 11/15/2018 0929   LDLCALC 101 (H) 11/15/2018 0929   Hepatic Function Panel     Component Value Date/Time   PROT 7.1 06/14/2018 1126    ALBUMIN 4.2 06/14/2018 1126   AST 16 06/14/2018 1126   ALT 14 06/14/2018 1126   ALKPHOS 79 06/14/2018 1126   BILITOT 0.4 06/14/2018 1126      Component Value Date/Time   TSH 1.790 06/14/2018 1126      I, Trixie Dredge, am acting as transcriptionist for Ilene Qua, MD  I have reviewed the above documentation for accuracy and completeness, and I agree with the above. - Ilene Qua, MD

## 2018-12-01 ENCOUNTER — Other Ambulatory Visit: Payer: Self-pay

## 2018-12-01 ENCOUNTER — Ambulatory Visit (INDEPENDENT_AMBULATORY_CARE_PROVIDER_SITE_OTHER): Payer: 59 | Admitting: Family Medicine

## 2018-12-01 ENCOUNTER — Encounter (INDEPENDENT_AMBULATORY_CARE_PROVIDER_SITE_OTHER): Payer: Self-pay | Admitting: Family Medicine

## 2018-12-01 DIAGNOSIS — Z6841 Body Mass Index (BMI) 40.0 and over, adult: Secondary | ICD-10-CM | POA: Diagnosis not present

## 2018-12-01 DIAGNOSIS — E559 Vitamin D deficiency, unspecified: Secondary | ICD-10-CM

## 2018-12-01 MED ORDER — VITAMIN D (ERGOCALCIFEROL) 1.25 MG (50000 UNIT) PO CAPS: 50000.0000 [IU] | ORAL_CAPSULE | ORAL | 0 refills | Status: AC

## 2018-12-01 NOTE — Progress Notes (Signed)
Office: [phone]  /  Fax: [phone] TeleHealth Visit:  Katie Holmes has verbally consented to this TeleHealth visit today. The patient is located at home, the provider is located at the [employer] [facility] office. The participants in this visit include the listed provider and patient. The visit was conducted today via [email or web address].  HPI:   Chief Complaint: OBESITY Katie Holmes is here to discuss her progress with her obesity treatment plan. She is on the Category 3 plan and is following her eating plan approximately 30 % of the time. She states she is exercising 0 minutes 0 times per week. Katie Holmes has struggled more with her meal plan in the last month due to having a foster daughter that was challenging. Her foster daughter went back home and she feels it will be easier now to get back on track. She is bored with her plan and would like to look at other options.  We were unable to weigh the patient today for this TeleHealth visit. She feels as if she has maintained weight since her last visit. She has lost 11 lbs since starting treatment with [place].  Vitamin D Deficiency Katie Holmes has a diagnosis of vitamin D deficiency. She is currently stable on vit D. Katie Holmes denies nausea, vomiting, or muscle weakness.  ASSESSMENT AND PLAN:  Vitamin D deficiency - Plan: Vitamin D, Ergocalciferol, (DRISDOL) 1.25 MG (50000 UT) CAPS capsule  Class 3 severe obesity with serious comorbidity and body mass index (BMI) of 40.0 to 44.9 in adult, unspecified obesity type ([place])  PLAN:  Vitamin D Deficiency Katie Holmes was informed that low vitamin D levels contribute to fatigue and are associated with obesity, breast, and colon cancer. Katie Holmes agrees to continue to take prescription Vit D @50 ,000 IU every week #4 with no refills and will follow up for routine testing of vitamin D, at least 2-3 times per year. She was informed of the risk of over-replacement of vitamin D and agrees to not increase her dose unless she discusses this with  [place] first. Katie Holmes agrees to follow up in 2 to 3 weeks as directed.  Obesity Katie Holmes is currently in the action stage of change. As such, her goal is to continue with weight loss efforts. She has agreed to follow the Category 3 plan or keep a food journal of 1400 to 1600 calories and 85+ grams of protein daily. Katie Holmes has been instructed to work up to a goal of 150 minutes of combined cardio and strengthening exercise per week for weight loss and overall health benefits. We discussed the following Behavioral Modification Strategies today: work on meal planning and easy cooking plans, keeping healthy foods in the home, keep a strict food journal, and emotional eating strategies.  Katie Holmes has agreed to follow up with our clinic in 2 to 3 weeks. She was informed of the importance of frequent follow up visits to maximize her success with intensive lifestyle modifications for her multiple health conditions.  ALLERGIES: No Known Allergies  MEDICATIONS: Current Outpatient Medications on File Prior to Visit  Medication Sig Dispense Refill  . buPROPion (WELLBUTRIN SR) 150 MG 12 hr tablet Take 1 tablet (150 mg total) by mouth daily. 90 tablet 0  . ibuprofen (ADVIL,MOTRIN) 200 MG tablet Take 200 mg by mouth every 6 (six) hours as needed.    Marland Kitchen levonorgestrel-ethinyl estradiol (AVIANE,ALESSE,LESSINA) 0.1-20 MG-MCG tablet Take 1 tablet by mouth daily.    . valACYclovir (VALTREX) 500 MG tablet Take 500 mg by mouth daily.  No current facility-administered medications on file prior to visit.     PAST MEDICAL HISTORY: Past Medical History:  Diagnosis Date  . Allergy    seasonal  . Cystitis   . Fatigue   . Fractured toe   . Heart murmur    since childhood- no problems  . History of cryosurgery of cervix complicating pregnancy   . History of herpes simplex type 2 infection   . History of myomectomy   . Intermittent knee pain   . Joint pain   . Obesity complicating pregnancy   . Palpitations     PAST  SURGICAL HISTORY: Past Surgical History:  Procedure Laterality Date  . CESAREAN SECTION  05/29/2011   Procedure: CESAREAN SECTION;  Surgeon: Eldred Manges, MD;  Location: [facility];  Service: Gynecology;  Laterality: N/A;  Primary/placenta previa BMI 47  . eye surgery     10 years ago  . MYOMECTOMY  2011  . WISDOM TOOTH EXTRACTION  1989    SOCIAL HISTORY: Social History   Tobacco Use  . Smoking status: Never Smoker  . Smokeless tobacco: Never Used  Substance Use Topics  . Alcohol use: No  . Drug use: No    FAMILY HISTORY: Family History  Problem Relation Age of Onset  . Hypertension Maternal Grandmother   . Diabetes Maternal Grandmother   . Stroke Maternal Grandmother   . Cancer Maternal Grandfather   . Alcohol abuse Maternal Grandfather   . Heart disease Paternal Grandmother   . Hypertension Paternal Grandmother   . Obesity Mother     ROS: Review of Systems  Gastrointestinal: Negative for nausea and vomiting.  Musculoskeletal:       Negative for muscle weakness.    PHYSICAL EXAM: Pt in no acute distress  RECENT LABS AND TESTS: BMET    Component Value Date/Time   NA 141 06/14/2018 1126   K 4.6 06/14/2018 1126   CL 103 06/14/2018 1126   CO2 23 06/14/2018 1126   GLUCOSE 84 06/14/2018 1126   BUN 9 06/14/2018 1126   CREATININE 0.91 06/14/2018 1126   CALCIUM 9.2 06/14/2018 1126   GFRNONAA 74 06/14/2018 1126   GFRAA 86 06/14/2018 1126   Lab Results  Component Value Date   HGBA1C 5.0 11/15/2018   HGBA1C 5.2 06/14/2018   Lab Results  Component Value Date   INSULIN 7.8 11/15/2018   INSULIN 12.4 06/14/2018   CBC    Component Value Date/Time   WBC 6.6 06/14/2018 1126   WBC 12.3 (H) 05/30/2011 0548   RBC 4.62 06/14/2018 1126   RBC 3.36 (L) 05/30/2011 0548   HGB 14.2 06/14/2018 1126   HCT 42.7 06/14/2018 1126   PLT 108 (L) 05/30/2011 0548   MCV 92 06/14/2018 1126   MCH 30.7 06/14/2018 1126   MCH 31.0 05/30/2011 0548   MCHC 33.3 06/14/2018 1126    MCHC 34.1 05/30/2011 0548   RDW 12.6 06/14/2018 1126   LYMPHSABS 1.6 06/14/2018 1126   EOSABS 0.3 06/14/2018 1126   BASOSABS 0.0 06/14/2018 1126   Iron/TIBC/Ferritin/ %Sat No results found for: IRON, TIBC, FERRITIN, IRONPCTSAT Lipid Panel     Component Value Date/Time   CHOL 174 11/15/2018 0929   TRIG 59 11/15/2018 0929   HDL 61 11/15/2018 0929   LDLCALC 101 (H) 11/15/2018 0929   Hepatic Function Panel     Component Value Date/Time   PROT 7.1 06/14/2018 1126   ALBUMIN 4.2 06/14/2018 1126   AST 16 06/14/2018 1126   ALT  14 06/14/2018 1126   ALKPHOS 79 06/14/2018 1126   BILITOT 0.4 06/14/2018 1126      Component Value Date/Time   TSH 1.790 06/14/2018 1126   Results for DAVIANA, HAYMAKER (MRN [id number]) as of 12/01/2018 11:38  Ref. Range 11/15/2018 09:29  Vitamin D, 25-Hydroxy Latest Ref Range: 30.0 - 100.0 ng/mL 53.8    I, Marcille Blanco, CMA, am acting as transcriptionist for Starlyn Skeans, MD I have reviewed the above documentation for accuracy and completeness, and I agree with the above. -Dennard Nip, MD

## 2019-02-17 ENCOUNTER — Other Ambulatory Visit (INDEPENDENT_AMBULATORY_CARE_PROVIDER_SITE_OTHER): Payer: Self-pay | Admitting: Family Medicine

## 2019-02-17 DIAGNOSIS — F3289 Other specified depressive episodes: Secondary | ICD-10-CM

## 2019-04-24 DIAGNOSIS — M79673 Pain in unspecified foot: Secondary | ICD-10-CM | POA: Diagnosis not present

## 2019-04-24 DIAGNOSIS — B009 Herpesviral infection, unspecified: Secondary | ICD-10-CM | POA: Diagnosis not present

## 2019-04-24 DIAGNOSIS — L309 Dermatitis, unspecified: Secondary | ICD-10-CM | POA: Diagnosis not present

## 2019-04-24 DIAGNOSIS — Z0000 Encounter for general adult medical examination without abnormal findings: Secondary | ICD-10-CM | POA: Diagnosis not present

## 2019-04-25 DIAGNOSIS — Z13220 Encounter for screening for lipoid disorders: Secondary | ICD-10-CM | POA: Diagnosis not present

## 2019-04-25 DIAGNOSIS — Z131 Encounter for screening for diabetes mellitus: Secondary | ICD-10-CM | POA: Diagnosis not present

## 2019-04-25 DIAGNOSIS — Z23 Encounter for immunization: Secondary | ICD-10-CM | POA: Diagnosis not present

## 2019-05-10 DIAGNOSIS — Z1211 Encounter for screening for malignant neoplasm of colon: Secondary | ICD-10-CM | POA: Diagnosis not present

## 2019-05-10 DIAGNOSIS — Z01419 Encounter for gynecological examination (general) (routine) without abnormal findings: Secondary | ICD-10-CM | POA: Diagnosis not present

## 2019-05-10 DIAGNOSIS — Z1231 Encounter for screening mammogram for malignant neoplasm of breast: Secondary | ICD-10-CM | POA: Diagnosis not present

## 2019-05-10 DIAGNOSIS — Z124 Encounter for screening for malignant neoplasm of cervix: Secondary | ICD-10-CM | POA: Diagnosis not present

## 2019-11-13 DIAGNOSIS — Z1159 Encounter for screening for other viral diseases: Secondary | ICD-10-CM | POA: Diagnosis not present

## 2019-11-16 DIAGNOSIS — Z1211 Encounter for screening for malignant neoplasm of colon: Secondary | ICD-10-CM | POA: Diagnosis not present

## 2020-05-14 DIAGNOSIS — Z0000 Encounter for general adult medical examination without abnormal findings: Secondary | ICD-10-CM | POA: Diagnosis not present

## 2020-05-14 DIAGNOSIS — G479 Sleep disorder, unspecified: Secondary | ICD-10-CM | POA: Diagnosis not present

## 2020-05-14 DIAGNOSIS — Z131 Encounter for screening for diabetes mellitus: Secondary | ICD-10-CM | POA: Diagnosis not present

## 2020-05-14 DIAGNOSIS — Z13220 Encounter for screening for lipoid disorders: Secondary | ICD-10-CM | POA: Diagnosis not present

## 2020-05-14 DIAGNOSIS — Z23 Encounter for immunization: Secondary | ICD-10-CM | POA: Diagnosis not present

## 2020-05-14 DIAGNOSIS — B354 Tinea corporis: Secondary | ICD-10-CM | POA: Diagnosis not present

## 2020-05-21 DIAGNOSIS — Z01419 Encounter for gynecological examination (general) (routine) without abnormal findings: Secondary | ICD-10-CM | POA: Diagnosis not present

## 2020-05-21 DIAGNOSIS — Z1231 Encounter for screening mammogram for malignant neoplasm of breast: Secondary | ICD-10-CM | POA: Diagnosis not present

## 2020-05-21 DIAGNOSIS — Z309 Encounter for contraceptive management, unspecified: Secondary | ICD-10-CM | POA: Diagnosis not present

## 2020-05-21 DIAGNOSIS — Z124 Encounter for screening for malignant neoplasm of cervix: Secondary | ICD-10-CM | POA: Diagnosis not present

## 2020-05-21 DIAGNOSIS — B009 Herpesviral infection, unspecified: Secondary | ICD-10-CM | POA: Diagnosis not present

## 2020-07-30 DIAGNOSIS — Z23 Encounter for immunization: Secondary | ICD-10-CM | POA: Diagnosis not present

## 2021-05-15 DIAGNOSIS — B009 Herpesviral infection, unspecified: Secondary | ICD-10-CM | POA: Diagnosis not present

## 2021-05-15 DIAGNOSIS — L309 Dermatitis, unspecified: Secondary | ICD-10-CM | POA: Diagnosis not present

## 2021-05-15 DIAGNOSIS — M25569 Pain in unspecified knee: Secondary | ICD-10-CM | POA: Diagnosis not present

## 2021-05-15 DIAGNOSIS — Z0000 Encounter for general adult medical examination without abnormal findings: Secondary | ICD-10-CM | POA: Diagnosis not present

## 2021-05-15 DIAGNOSIS — Z13220 Encounter for screening for lipoid disorders: Secondary | ICD-10-CM | POA: Diagnosis not present

## 2021-05-15 DIAGNOSIS — Z131 Encounter for screening for diabetes mellitus: Secondary | ICD-10-CM | POA: Diagnosis not present

## 2021-05-15 DIAGNOSIS — J309 Allergic rhinitis, unspecified: Secondary | ICD-10-CM | POA: Diagnosis not present

## 2021-06-25 DIAGNOSIS — B009 Herpesviral infection, unspecified: Secondary | ICD-10-CM | POA: Diagnosis not present

## 2021-06-25 DIAGNOSIS — B977 Papillomavirus as the cause of diseases classified elsewhere: Secondary | ICD-10-CM | POA: Diagnosis not present

## 2021-06-25 DIAGNOSIS — Z1231 Encounter for screening mammogram for malignant neoplasm of breast: Secondary | ICD-10-CM | POA: Diagnosis not present

## 2021-06-25 DIAGNOSIS — Z01419 Encounter for gynecological examination (general) (routine) without abnormal findings: Secondary | ICD-10-CM | POA: Diagnosis not present

## 2022-02-04 DIAGNOSIS — Z1389 Encounter for screening for other disorder: Secondary | ICD-10-CM | POA: Diagnosis not present

## 2022-02-04 DIAGNOSIS — R635 Abnormal weight gain: Secondary | ICD-10-CM | POA: Diagnosis not present

## 2022-02-04 DIAGNOSIS — R0602 Shortness of breath: Secondary | ICD-10-CM | POA: Diagnosis not present

## 2022-02-04 DIAGNOSIS — Z131 Encounter for screening for diabetes mellitus: Secondary | ICD-10-CM | POA: Diagnosis not present

## 2022-02-04 DIAGNOSIS — R5383 Other fatigue: Secondary | ICD-10-CM | POA: Diagnosis not present

## 2022-02-04 DIAGNOSIS — E559 Vitamin D deficiency, unspecified: Secondary | ICD-10-CM | POA: Diagnosis not present

## 2022-02-16 ENCOUNTER — Other Ambulatory Visit (HOSPITAL_BASED_OUTPATIENT_CLINIC_OR_DEPARTMENT_OTHER): Payer: Self-pay

## 2022-02-16 MED ORDER — SAXENDA 18 MG/3ML ~~LOC~~ SOPN
3.0000 mg | PEN_INJECTOR | Freq: Every day | SUBCUTANEOUS | 0 refills | Status: DC
  Filled 2022-02-16 (×2): qty 15, 30d supply, fill #0

## 2022-02-16 MED ORDER — INSULIN PEN NEEDLE 32G X 4 MM MISC
0 refills | Status: DC
  Filled 2022-02-16: qty 100, 30d supply, fill #0
  Filled 2022-02-16: qty 100, 90d supply, fill #0

## 2022-02-25 DIAGNOSIS — E559 Vitamin D deficiency, unspecified: Secondary | ICD-10-CM | POA: Diagnosis not present

## 2022-02-25 DIAGNOSIS — E669 Obesity, unspecified: Secondary | ICD-10-CM | POA: Diagnosis not present

## 2022-03-24 ENCOUNTER — Other Ambulatory Visit (HOSPITAL_BASED_OUTPATIENT_CLINIC_OR_DEPARTMENT_OTHER): Payer: Self-pay

## 2022-03-24 DIAGNOSIS — E559 Vitamin D deficiency, unspecified: Secondary | ICD-10-CM | POA: Diagnosis not present

## 2022-03-24 DIAGNOSIS — Z6841 Body Mass Index (BMI) 40.0 and over, adult: Secondary | ICD-10-CM | POA: Diagnosis not present

## 2022-03-24 DIAGNOSIS — E669 Obesity, unspecified: Secondary | ICD-10-CM | POA: Diagnosis not present

## 2022-03-24 MED ORDER — INSULIN PEN NEEDLE 32G X 4 MM MISC: 0 refills | Status: AC

## 2022-03-24 MED ORDER — SAXENDA 18 MG/3ML ~~LOC~~ SOPN
3.0000 mg | PEN_INJECTOR | Freq: Every day | SUBCUTANEOUS | 2 refills | Status: AC
  Filled 2022-03-24 – 2022-04-08 (×2): qty 15, 30d supply, fill #0

## 2022-03-26 ENCOUNTER — Encounter (HOSPITAL_BASED_OUTPATIENT_CLINIC_OR_DEPARTMENT_OTHER): Payer: Self-pay

## 2022-03-26 ENCOUNTER — Other Ambulatory Visit (HOSPITAL_BASED_OUTPATIENT_CLINIC_OR_DEPARTMENT_OTHER): Payer: Self-pay

## 2022-04-08 ENCOUNTER — Other Ambulatory Visit (HOSPITAL_BASED_OUTPATIENT_CLINIC_OR_DEPARTMENT_OTHER): Payer: Self-pay

## 2022-04-22 DIAGNOSIS — E559 Vitamin D deficiency, unspecified: Secondary | ICD-10-CM | POA: Diagnosis not present

## 2022-04-22 DIAGNOSIS — Z6841 Body Mass Index (BMI) 40.0 and over, adult: Secondary | ICD-10-CM | POA: Diagnosis not present

## 2022-04-22 DIAGNOSIS — E65 Localized adiposity: Secondary | ICD-10-CM | POA: Diagnosis not present

## 2022-04-22 DIAGNOSIS — E669 Obesity, unspecified: Secondary | ICD-10-CM | POA: Diagnosis not present

## 2022-05-12 DIAGNOSIS — Z0000 Encounter for general adult medical examination without abnormal findings: Secondary | ICD-10-CM | POA: Diagnosis not present

## 2022-05-12 DIAGNOSIS — E7800 Pure hypercholesterolemia, unspecified: Secondary | ICD-10-CM | POA: Diagnosis not present

## 2022-05-12 DIAGNOSIS — Z131 Encounter for screening for diabetes mellitus: Secondary | ICD-10-CM | POA: Diagnosis not present

## 2022-05-12 DIAGNOSIS — E559 Vitamin D deficiency, unspecified: Secondary | ICD-10-CM | POA: Diagnosis not present

## 2022-05-20 DIAGNOSIS — E65 Localized adiposity: Secondary | ICD-10-CM | POA: Diagnosis not present

## 2022-05-20 DIAGNOSIS — E669 Obesity, unspecified: Secondary | ICD-10-CM | POA: Diagnosis not present

## 2022-05-20 DIAGNOSIS — E559 Vitamin D deficiency, unspecified: Secondary | ICD-10-CM | POA: Diagnosis not present

## 2022-06-16 DIAGNOSIS — E669 Obesity, unspecified: Secondary | ICD-10-CM | POA: Diagnosis not present

## 2022-06-16 DIAGNOSIS — Z6841 Body Mass Index (BMI) 40.0 and over, adult: Secondary | ICD-10-CM | POA: Diagnosis not present

## 2022-06-16 DIAGNOSIS — R632 Polyphagia: Secondary | ICD-10-CM | POA: Diagnosis not present

## 2022-06-16 DIAGNOSIS — E559 Vitamin D deficiency, unspecified: Secondary | ICD-10-CM | POA: Diagnosis not present

## 2022-07-29 DIAGNOSIS — R632 Polyphagia: Secondary | ICD-10-CM | POA: Diagnosis not present

## 2022-07-29 DIAGNOSIS — E559 Vitamin D deficiency, unspecified: Secondary | ICD-10-CM | POA: Diagnosis not present

## 2022-07-29 DIAGNOSIS — E669 Obesity, unspecified: Secondary | ICD-10-CM | POA: Diagnosis not present

## 2022-08-26 DIAGNOSIS — E559 Vitamin D deficiency, unspecified: Secondary | ICD-10-CM | POA: Diagnosis not present

## 2022-08-26 DIAGNOSIS — E669 Obesity, unspecified: Secondary | ICD-10-CM | POA: Diagnosis not present

## 2022-08-26 DIAGNOSIS — R632 Polyphagia: Secondary | ICD-10-CM | POA: Diagnosis not present

## 2022-09-17 DIAGNOSIS — Z8742 Personal history of other diseases of the female genital tract: Secondary | ICD-10-CM | POA: Diagnosis not present

## 2022-09-17 DIAGNOSIS — Z5181 Encounter for therapeutic drug level monitoring: Secondary | ICD-10-CM | POA: Diagnosis not present

## 2022-09-17 DIAGNOSIS — Z01419 Encounter for gynecological examination (general) (routine) without abnormal findings: Secondary | ICD-10-CM | POA: Diagnosis not present

## 2022-09-17 DIAGNOSIS — N912 Amenorrhea, unspecified: Secondary | ICD-10-CM | POA: Diagnosis not present

## 2022-09-17 DIAGNOSIS — Z1231 Encounter for screening mammogram for malignant neoplasm of breast: Secondary | ICD-10-CM | POA: Diagnosis not present

## 2022-09-22 DIAGNOSIS — E559 Vitamin D deficiency, unspecified: Secondary | ICD-10-CM | POA: Diagnosis not present

## 2022-09-22 DIAGNOSIS — E65 Localized adiposity: Secondary | ICD-10-CM | POA: Diagnosis not present

## 2022-09-22 DIAGNOSIS — R632 Polyphagia: Secondary | ICD-10-CM | POA: Diagnosis not present

## 2022-09-22 DIAGNOSIS — E669 Obesity, unspecified: Secondary | ICD-10-CM | POA: Diagnosis not present

## 2022-11-03 DIAGNOSIS — E669 Obesity, unspecified: Secondary | ICD-10-CM | POA: Diagnosis not present

## 2022-11-03 DIAGNOSIS — E65 Localized adiposity: Secondary | ICD-10-CM | POA: Diagnosis not present

## 2022-11-03 DIAGNOSIS — E559 Vitamin D deficiency, unspecified: Secondary | ICD-10-CM | POA: Diagnosis not present

## 2022-11-03 DIAGNOSIS — R632 Polyphagia: Secondary | ICD-10-CM | POA: Diagnosis not present

## 2022-11-24 ENCOUNTER — Other Ambulatory Visit (HOSPITAL_BASED_OUTPATIENT_CLINIC_OR_DEPARTMENT_OTHER): Payer: Self-pay

## 2022-11-24 ENCOUNTER — Encounter (HOSPITAL_BASED_OUTPATIENT_CLINIC_OR_DEPARTMENT_OTHER): Payer: Self-pay | Admitting: Pharmacist

## 2022-11-24 MED ORDER — ZEPBOUND 7.5 MG/0.5ML ~~LOC~~ SOAJ
7.5000 mg | SUBCUTANEOUS | 0 refills | Status: AC
  Filled 2022-11-24 – 2022-11-25 (×3): qty 2, 28d supply, fill #0

## 2022-11-25 ENCOUNTER — Other Ambulatory Visit (HOSPITAL_BASED_OUTPATIENT_CLINIC_OR_DEPARTMENT_OTHER): Payer: Self-pay

## 2022-12-01 ENCOUNTER — Other Ambulatory Visit (HOSPITAL_BASED_OUTPATIENT_CLINIC_OR_DEPARTMENT_OTHER): Payer: Self-pay

## 2022-12-01 MED ORDER — ZEPBOUND 2.5 MG/0.5ML ~~LOC~~ SOAJ
2.5000 mg | SUBCUTANEOUS | 0 refills | Status: DC
  Filled 2022-12-01: qty 2, 28d supply, fill #0

## 2022-12-08 ENCOUNTER — Other Ambulatory Visit (HOSPITAL_BASED_OUTPATIENT_CLINIC_OR_DEPARTMENT_OTHER): Payer: Self-pay

## 2022-12-08 DIAGNOSIS — E65 Localized adiposity: Secondary | ICD-10-CM | POA: Diagnosis not present

## 2022-12-08 DIAGNOSIS — R632 Polyphagia: Secondary | ICD-10-CM | POA: Diagnosis not present

## 2022-12-08 DIAGNOSIS — E669 Obesity, unspecified: Secondary | ICD-10-CM | POA: Diagnosis not present

## 2022-12-08 DIAGNOSIS — E559 Vitamin D deficiency, unspecified: Secondary | ICD-10-CM | POA: Diagnosis not present

## 2022-12-08 MED ORDER — ZEPBOUND 5 MG/0.5ML ~~LOC~~ SOAJ
5.0000 mg | SUBCUTANEOUS | 0 refills | Status: DC
  Filled 2022-12-24: qty 2, 28d supply, fill #0

## 2022-12-08 MED ORDER — ZEPBOUND 2.5 MG/0.5ML ~~LOC~~ SOAJ
2.5000 mg | SUBCUTANEOUS | 0 refills | Status: AC
  Filled 2022-12-08: qty 2, 28d supply, fill #0

## 2022-12-24 ENCOUNTER — Other Ambulatory Visit (HOSPITAL_BASED_OUTPATIENT_CLINIC_OR_DEPARTMENT_OTHER): Payer: Self-pay

## 2022-12-25 ENCOUNTER — Other Ambulatory Visit (HOSPITAL_BASED_OUTPATIENT_CLINIC_OR_DEPARTMENT_OTHER): Payer: Self-pay

## 2023-01-05 ENCOUNTER — Other Ambulatory Visit (HOSPITAL_BASED_OUTPATIENT_CLINIC_OR_DEPARTMENT_OTHER): Payer: Self-pay

## 2023-01-05 DIAGNOSIS — R632 Polyphagia: Secondary | ICD-10-CM | POA: Diagnosis not present

## 2023-01-05 DIAGNOSIS — E669 Obesity, unspecified: Secondary | ICD-10-CM | POA: Diagnosis not present

## 2023-01-05 DIAGNOSIS — E65 Localized adiposity: Secondary | ICD-10-CM | POA: Diagnosis not present

## 2023-01-05 DIAGNOSIS — E559 Vitamin D deficiency, unspecified: Secondary | ICD-10-CM | POA: Diagnosis not present

## 2023-01-05 MED ORDER — ZEPBOUND 5 MG/0.5ML ~~LOC~~ SOAJ
5.0000 mg | SUBCUTANEOUS | 1 refills | Status: AC
  Filled 2023-01-05 – 2023-01-21 (×2): qty 2, 28d supply, fill #0
  Filled 2023-02-11: qty 2, 28d supply, fill #1

## 2023-01-21 ENCOUNTER — Other Ambulatory Visit (HOSPITAL_BASED_OUTPATIENT_CLINIC_OR_DEPARTMENT_OTHER): Payer: Self-pay

## 2023-01-21 ENCOUNTER — Other Ambulatory Visit: Payer: Self-pay

## 2023-02-04 ENCOUNTER — Other Ambulatory Visit (HOSPITAL_BASED_OUTPATIENT_CLINIC_OR_DEPARTMENT_OTHER): Payer: Self-pay

## 2023-02-04 DIAGNOSIS — E669 Obesity, unspecified: Secondary | ICD-10-CM | POA: Diagnosis not present

## 2023-02-04 DIAGNOSIS — R632 Polyphagia: Secondary | ICD-10-CM | POA: Diagnosis not present

## 2023-02-04 DIAGNOSIS — E559 Vitamin D deficiency, unspecified: Secondary | ICD-10-CM | POA: Diagnosis not present

## 2023-02-04 DIAGNOSIS — E65 Localized adiposity: Secondary | ICD-10-CM | POA: Diagnosis not present

## 2023-02-04 MED ORDER — ZEPBOUND 5 MG/0.5ML ~~LOC~~ SOAJ
5.0000 mg | SUBCUTANEOUS | 1 refills | Status: AC
  Filled 2023-02-04 – 2023-03-10 (×2): qty 2, 28d supply, fill #0
  Filled 2023-04-04: qty 2, 28d supply, fill #1

## 2023-02-11 ENCOUNTER — Other Ambulatory Visit (HOSPITAL_BASED_OUTPATIENT_CLINIC_OR_DEPARTMENT_OTHER): Payer: Self-pay

## 2023-03-11 ENCOUNTER — Other Ambulatory Visit (HOSPITAL_BASED_OUTPATIENT_CLINIC_OR_DEPARTMENT_OTHER): Payer: Self-pay

## 2023-03-16 ENCOUNTER — Other Ambulatory Visit (HOSPITAL_BASED_OUTPATIENT_CLINIC_OR_DEPARTMENT_OTHER): Payer: Self-pay

## 2023-03-16 DIAGNOSIS — E65 Localized adiposity: Secondary | ICD-10-CM | POA: Diagnosis not present

## 2023-03-16 DIAGNOSIS — R632 Polyphagia: Secondary | ICD-10-CM | POA: Diagnosis not present

## 2023-03-16 DIAGNOSIS — E559 Vitamin D deficiency, unspecified: Secondary | ICD-10-CM | POA: Diagnosis not present

## 2023-03-16 MED ORDER — ZEPBOUND 5 MG/0.5ML ~~LOC~~ SOAJ
5.0000 mg | SUBCUTANEOUS | 1 refills | Status: AC
  Filled 2023-03-16 – 2023-05-03 (×2): qty 2, 28d supply, fill #0
  Filled 2023-06-07: qty 2, 28d supply, fill #1

## 2023-03-31 DIAGNOSIS — L259 Unspecified contact dermatitis, unspecified cause: Secondary | ICD-10-CM | POA: Diagnosis not present

## 2023-04-05 ENCOUNTER — Other Ambulatory Visit (HOSPITAL_BASED_OUTPATIENT_CLINIC_OR_DEPARTMENT_OTHER): Payer: Self-pay

## 2023-04-13 DIAGNOSIS — Z111 Encounter for screening for respiratory tuberculosis: Secondary | ICD-10-CM | POA: Diagnosis not present

## 2023-04-13 DIAGNOSIS — E7800 Pure hypercholesterolemia, unspecified: Secondary | ICD-10-CM | POA: Diagnosis not present

## 2023-04-13 DIAGNOSIS — E559 Vitamin D deficiency, unspecified: Secondary | ICD-10-CM | POA: Diagnosis not present

## 2023-04-13 DIAGNOSIS — Z0000 Encounter for general adult medical examination without abnormal findings: Secondary | ICD-10-CM | POA: Diagnosis not present

## 2023-04-20 ENCOUNTER — Other Ambulatory Visit (HOSPITAL_BASED_OUTPATIENT_CLINIC_OR_DEPARTMENT_OTHER): Payer: Self-pay

## 2023-04-20 DIAGNOSIS — E669 Obesity, unspecified: Secondary | ICD-10-CM | POA: Diagnosis not present

## 2023-04-20 DIAGNOSIS — R632 Polyphagia: Secondary | ICD-10-CM | POA: Diagnosis not present

## 2023-04-20 DIAGNOSIS — E65 Localized adiposity: Secondary | ICD-10-CM | POA: Diagnosis not present

## 2023-04-20 DIAGNOSIS — E559 Vitamin D deficiency, unspecified: Secondary | ICD-10-CM | POA: Diagnosis not present

## 2023-04-20 MED ORDER — ZEPBOUND 5 MG/0.5ML ~~LOC~~ SOAJ
5.0000 mg | SUBCUTANEOUS | 1 refills | Status: AC
  Filled 2023-04-20 – 2023-07-07 (×2): qty 2, 28d supply, fill #0
  Filled 2023-08-03: qty 2, 28d supply, fill #1

## 2023-05-04 ENCOUNTER — Other Ambulatory Visit: Payer: Self-pay

## 2023-05-04 ENCOUNTER — Other Ambulatory Visit (HOSPITAL_BASED_OUTPATIENT_CLINIC_OR_DEPARTMENT_OTHER): Payer: Self-pay

## 2023-05-18 ENCOUNTER — Other Ambulatory Visit (HOSPITAL_BASED_OUTPATIENT_CLINIC_OR_DEPARTMENT_OTHER): Payer: Self-pay

## 2023-05-18 DIAGNOSIS — E559 Vitamin D deficiency, unspecified: Secondary | ICD-10-CM | POA: Diagnosis not present

## 2023-05-18 DIAGNOSIS — E65 Localized adiposity: Secondary | ICD-10-CM | POA: Diagnosis not present

## 2023-05-18 DIAGNOSIS — R632 Polyphagia: Secondary | ICD-10-CM | POA: Diagnosis not present

## 2023-05-18 MED ORDER — ZEPBOUND 5 MG/0.5ML ~~LOC~~ SOAJ
5.0000 mg | SUBCUTANEOUS | 1 refills | Status: AC
  Filled 2023-05-18 – 2023-09-05 (×2): qty 2, 28d supply, fill #0
  Filled 2023-09-30: qty 2, 28d supply, fill #1

## 2023-05-19 DIAGNOSIS — L258 Unspecified contact dermatitis due to other agents: Secondary | ICD-10-CM | POA: Diagnosis not present

## 2023-05-19 DIAGNOSIS — L818 Other specified disorders of pigmentation: Secondary | ICD-10-CM | POA: Diagnosis not present

## 2023-05-19 DIAGNOSIS — Z1283 Encounter for screening for malignant neoplasm of skin: Secondary | ICD-10-CM | POA: Diagnosis not present

## 2023-05-19 DIAGNOSIS — D225 Melanocytic nevi of trunk: Secondary | ICD-10-CM | POA: Diagnosis not present

## 2023-05-19 DIAGNOSIS — L814 Other melanin hyperpigmentation: Secondary | ICD-10-CM | POA: Diagnosis not present

## 2023-06-08 ENCOUNTER — Other Ambulatory Visit (HOSPITAL_BASED_OUTPATIENT_CLINIC_OR_DEPARTMENT_OTHER): Payer: Self-pay

## 2023-07-07 ENCOUNTER — Other Ambulatory Visit (HOSPITAL_BASED_OUTPATIENT_CLINIC_OR_DEPARTMENT_OTHER): Payer: Self-pay

## 2023-08-04 ENCOUNTER — Other Ambulatory Visit (HOSPITAL_BASED_OUTPATIENT_CLINIC_OR_DEPARTMENT_OTHER): Payer: Self-pay

## 2023-08-12 ENCOUNTER — Other Ambulatory Visit (HOSPITAL_BASED_OUTPATIENT_CLINIC_OR_DEPARTMENT_OTHER): Payer: Self-pay

## 2023-08-12 DIAGNOSIS — E65 Localized adiposity: Secondary | ICD-10-CM | POA: Diagnosis not present

## 2023-08-12 DIAGNOSIS — E559 Vitamin D deficiency, unspecified: Secondary | ICD-10-CM | POA: Diagnosis not present

## 2023-08-12 DIAGNOSIS — E669 Obesity, unspecified: Secondary | ICD-10-CM | POA: Diagnosis not present

## 2023-08-12 DIAGNOSIS — R632 Polyphagia: Secondary | ICD-10-CM | POA: Diagnosis not present

## 2023-08-12 MED ORDER — ZEPBOUND 5 MG/0.5ML ~~LOC~~ SOAJ
5.0000 mg | SUBCUTANEOUS | 1 refills | Status: AC
  Filled 2023-08-12: qty 2, 28d supply, fill #0

## 2023-09-05 ENCOUNTER — Other Ambulatory Visit: Payer: Self-pay

## 2023-09-06 ENCOUNTER — Other Ambulatory Visit (HOSPITAL_BASED_OUTPATIENT_CLINIC_OR_DEPARTMENT_OTHER): Payer: Self-pay

## 2023-09-23 DIAGNOSIS — E65 Localized adiposity: Secondary | ICD-10-CM | POA: Diagnosis not present

## 2023-09-23 DIAGNOSIS — R632 Polyphagia: Secondary | ICD-10-CM | POA: Diagnosis not present

## 2023-09-23 DIAGNOSIS — E559 Vitamin D deficiency, unspecified: Secondary | ICD-10-CM | POA: Diagnosis not present

## 2023-09-30 ENCOUNTER — Other Ambulatory Visit (HOSPITAL_BASED_OUTPATIENT_CLINIC_OR_DEPARTMENT_OTHER): Payer: Self-pay

## 2023-10-11 ENCOUNTER — Other Ambulatory Visit (HOSPITAL_BASED_OUTPATIENT_CLINIC_OR_DEPARTMENT_OTHER): Payer: Self-pay

## 2023-11-16 DIAGNOSIS — R632 Polyphagia: Secondary | ICD-10-CM | POA: Diagnosis not present

## 2023-11-16 DIAGNOSIS — E65 Localized adiposity: Secondary | ICD-10-CM | POA: Diagnosis not present

## 2023-11-16 DIAGNOSIS — E669 Obesity, unspecified: Secondary | ICD-10-CM | POA: Diagnosis not present

## 2023-11-16 DIAGNOSIS — E559 Vitamin D deficiency, unspecified: Secondary | ICD-10-CM | POA: Diagnosis not present

## 2023-12-29 DIAGNOSIS — E559 Vitamin D deficiency, unspecified: Secondary | ICD-10-CM | POA: Diagnosis not present

## 2023-12-29 DIAGNOSIS — E65 Localized adiposity: Secondary | ICD-10-CM | POA: Diagnosis not present

## 2023-12-29 DIAGNOSIS — R632 Polyphagia: Secondary | ICD-10-CM | POA: Diagnosis not present

## 2023-12-29 DIAGNOSIS — E669 Obesity, unspecified: Secondary | ICD-10-CM | POA: Diagnosis not present

## 2024-02-15 DIAGNOSIS — E65 Localized adiposity: Secondary | ICD-10-CM | POA: Diagnosis not present

## 2024-02-15 DIAGNOSIS — F4321 Adjustment disorder with depressed mood: Secondary | ICD-10-CM | POA: Diagnosis not present

## 2024-02-15 DIAGNOSIS — E559 Vitamin D deficiency, unspecified: Secondary | ICD-10-CM | POA: Diagnosis not present

## 2024-02-16 ENCOUNTER — Other Ambulatory Visit: Payer: Self-pay | Admitting: Medical Genetics

## 2024-04-20 DIAGNOSIS — E559 Vitamin D deficiency, unspecified: Secondary | ICD-10-CM | POA: Diagnosis not present

## 2024-04-20 DIAGNOSIS — F4321 Adjustment disorder with depressed mood: Secondary | ICD-10-CM | POA: Diagnosis not present

## 2024-04-25 ENCOUNTER — Other Ambulatory Visit: Payer: Self-pay | Admitting: Family Medicine

## 2024-04-25 DIAGNOSIS — Z23 Encounter for immunization: Secondary | ICD-10-CM | POA: Diagnosis not present

## 2024-04-25 DIAGNOSIS — E7800 Pure hypercholesterolemia, unspecified: Secondary | ICD-10-CM | POA: Diagnosis not present

## 2024-04-25 DIAGNOSIS — E559 Vitamin D deficiency, unspecified: Secondary | ICD-10-CM | POA: Diagnosis not present

## 2024-04-25 DIAGNOSIS — Z1231 Encounter for screening mammogram for malignant neoplasm of breast: Secondary | ICD-10-CM

## 2024-04-25 DIAGNOSIS — Z0000 Encounter for general adult medical examination without abnormal findings: Secondary | ICD-10-CM | POA: Diagnosis not present

## 2024-04-25 DIAGNOSIS — L309 Dermatitis, unspecified: Secondary | ICD-10-CM | POA: Diagnosis not present

## 2024-04-25 DIAGNOSIS — B009 Herpesviral infection, unspecified: Secondary | ICD-10-CM | POA: Diagnosis not present

## 2024-05-17 ENCOUNTER — Ambulatory Visit
Admission: RE | Admit: 2024-05-17 | Discharge: 2024-05-17 | Disposition: A | Discharge status: Home / Self Care | Source: Ambulatory Visit | Attending: Family Medicine | Admitting: Family Medicine

## 2024-05-17 DIAGNOSIS — Z1231 Encounter for screening mammogram for malignant neoplasm of breast: Secondary | ICD-10-CM | POA: Diagnosis not present

## 2024-05-22 ENCOUNTER — Other Ambulatory Visit: Payer: Self-pay | Admitting: Family Medicine

## 2024-05-22 DIAGNOSIS — R928 Other abnormal and inconclusive findings on diagnostic imaging of breast: Secondary | ICD-10-CM

## 2024-05-25 ENCOUNTER — Other Ambulatory Visit: Payer: Self-pay | Admitting: Medical Genetics

## 2024-05-25 DIAGNOSIS — Z006 Encounter for examination for normal comparison and control in clinical research program: Secondary | ICD-10-CM

## 2024-05-30 ENCOUNTER — Other Ambulatory Visit: Payer: Self-pay | Admitting: Physician Assistant

## 2024-05-30 DIAGNOSIS — R928 Other abnormal and inconclusive findings on diagnostic imaging of breast: Secondary | ICD-10-CM

## 2024-06-09 ENCOUNTER — Ambulatory Visit
Admission: RE | Admit: 2024-06-09 | Discharge: 2024-06-09 | Disposition: A | Discharge status: Home / Self Care | Source: Ambulatory Visit | Attending: Family Medicine | Admitting: Family Medicine

## 2024-06-09 DIAGNOSIS — R928 Other abnormal and inconclusive findings on diagnostic imaging of breast: Secondary | ICD-10-CM

## 2024-06-09 DIAGNOSIS — N6002 Solitary cyst of left breast: Secondary | ICD-10-CM | POA: Diagnosis not present

## 2024-06-27 DIAGNOSIS — F4321 Adjustment disorder with depressed mood: Secondary | ICD-10-CM | POA: Diagnosis not present

## 2024-06-27 DIAGNOSIS — E559 Vitamin D deficiency, unspecified: Secondary | ICD-10-CM | POA: Diagnosis not present

## 2024-07-16 LAB — GENECONNECT MOLECULAR SCREEN: Genetic Analysis Overall Interpretation: NEGATIVE
